# Patient Record
Sex: Female | Born: 1995 | Race: Black or African American | Hispanic: No | Marital: Single | Smoking: Never smoker
Health system: Southern US, Community
[De-identification: ages and names within clinical notes are randomized; demographics above are authoritative.]

## PROBLEM LIST (undated history)

## (undated) ENCOUNTER — Inpatient Hospital Stay: Payer: Self-pay

## (undated) DIAGNOSIS — A549 Gonococcal infection, unspecified: Secondary | ICD-10-CM

## (undated) DIAGNOSIS — A749 Chlamydial infection, unspecified: Secondary | ICD-10-CM

## (undated) DIAGNOSIS — Z789 Other specified health status: Secondary | ICD-10-CM

## (undated) HISTORY — PX: TOOTH EXTRACTION: SUR596

---
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
## 2017-02-05 ENCOUNTER — Telehealth: Payer: Self-pay | Admitting: Obstetrics and Gynecology

## 2017-02-05 DIAGNOSIS — Z30013 Encounter for initial prescription of injectable contraceptive: Secondary | ICD-10-CM

## 2017-02-05 MED ORDER — MEDROXYPROGESTERONE ACETATE 150 MG/ML IM SUSP: 150.0000 mg | INTRAMUSCULAR | 1 refills | Status: DC

## 2017-02-05 NOTE — Telephone Encounter (Signed)
Please advise regarding medication change. Thank you.

## 2017-02-05 NOTE — Telephone Encounter (Signed)
Rx depo sent to pharmacy. Pt to RTO with menses for injection. Condoms for 1 mo. RN to notify pt. RTO 8/18 annual.

## 2017-02-05 NOTE — Telephone Encounter (Signed)
Pt is calling and was seen 12/06/16 for an birthcontrol conference. Pt is not liking having the patches. And would like to change to Depo injection.

## 2017-03-11 ENCOUNTER — Ambulatory Visit: Payer: Self-pay | Admitting: Obstetrics and Gynecology

## 2017-03-11 ENCOUNTER — Encounter: Payer: Self-pay | Admitting: Obstetrics and Gynecology

## 2017-06-10 ENCOUNTER — Ambulatory Visit (INDEPENDENT_AMBULATORY_CARE_PROVIDER_SITE_OTHER): Payer: Medicaid Other | Admitting: Podiatry

## 2017-06-10 VITALS — BP 130/79 | HR 78 | Resp 18

## 2017-06-10 DIAGNOSIS — L603 Nail dystrophy: Secondary | ICD-10-CM

## 2017-06-10 DIAGNOSIS — W450XXA Nail entering through skin, initial encounter: Secondary | ICD-10-CM

## 2017-06-10 NOTE — Progress Notes (Signed)
Subjective:     Patient ID: Briana Ortega, female   DOB: 07/07/1996, 21 y.o.   MRN: [id number]  HPI this patient presents the office with chief complaint of malformed big toenails on both feet.  She says she jammed her toes for months ago at work and there now appears to be discoloration forming on both toes  . She says she injured the toes 1 day and then the next day.  She says there was initially pain, but there has been no drainage or pus noted  . She presents the office today for an evaluation of her nail plates both big toes..   Review of Systems     Objective:   Physical Exam GENERAL APPEARANCE: Alert, conversant. Appropriately groomed. No acute distress.  VASCULAR: Pedal pulses are  palpable at  [facility].  Capillary refill time is immediate to all digits,  Normal temperature gradient.  Digital hair growth is present bilateral  NEUROLOGIC: sensation is normal to 5.07 monofilament at 5/5 sites bilateral.  Light touch is intact bilateral, Muscle strength normal.  MUSCULOSKELETAL: acceptable muscle strength, tone and stability bilateral.  Intrinsic muscluature intact bilateral.  Rectus appearance of foot and digits noted bilateral.  NAILS  are yellow disfigurement noted along the lateral border of the left great toe.  Both toes have an elevation of the distal plate of the hallux bilateral.  Normal. The nail attachment proximally.  No evidence of any redness, swelling or drainage.  No pain noted or elicited upon palpation DERMATOLOGIC: skin color, texture, and turgor are within normal limits.  No preulcerative lesions or ulcers  are seen, no interdigital maceration noted.  No open lesions present.   No drainage noted.      Assessment:     Nail Dystrophy  Nail Injury  Hallux nails  B/L    Plan:     IE  Discussed condition with the patient.  We are going to allow the nail to continue to grow out completely and then decide on a treatment plan at that time.  Return to clinic when  necessary   Helane GuntherGregory Tyee Vandevoorde DPM

## 2019-07-13 ENCOUNTER — Ambulatory Visit (INDEPENDENT_AMBULATORY_CARE_PROVIDER_SITE_OTHER): Payer: Medicaid Other | Admitting: Obstetrics and Gynecology

## 2019-07-13 ENCOUNTER — Encounter: Payer: Self-pay | Admitting: Obstetrics and Gynecology

## 2019-07-13 ENCOUNTER — Other Ambulatory Visit (HOSPITAL_COMMUNITY)
Admission: RE | Admit: 2019-07-13 | Discharge: 2019-07-13 | Disposition: A | Discharge status: Home / Self Care | Payer: Medicaid Other | Source: Ambulatory Visit | Attending: Obstetrics and Gynecology | Admitting: Obstetrics and Gynecology

## 2019-07-13 ENCOUNTER — Other Ambulatory Visit: Payer: Self-pay

## 2019-07-13 VITALS — BP 90/70 | Ht 66.5 in | Wt 113.0 lb

## 2019-07-13 DIAGNOSIS — Z113 Encounter for screening for infections with a predominantly sexual mode of transmission: Secondary | ICD-10-CM

## 2019-07-13 DIAGNOSIS — Z30011 Encounter for initial prescription of contraceptive pills: Secondary | ICD-10-CM

## 2019-07-13 MED ORDER — MICROGESTIN 24 FE 1-20 MG-MCG PO TABS: 1.0000 | ORAL_TABLET | Freq: Every day | ORAL | 3 refills | Status: DC

## 2019-07-13 NOTE — Progress Notes (Signed)
System, Provider Not In   Chief Complaint  Patient presents with  . Contraception    wants OCP's    HPI:      Briana Ortega is a 23 y.o. G1P1001 who LMP was Patient's last menstrual period was 06/26/2019 (approximate)., presents today for [facility] conf. Wants to start OCPs. Did xulane and depo in past. Had constant irreg bleeding with depo, doesn't remember issue with xulane. No hx of HTN, DVTs, migraines with aura. Menses are monthly, last 9 days, no BTB, mild dsymen. Pt is sex active, using condoms. No recent STD testing. Hx of gon/chlam in past.    Past Medical History:  Diagnosis Date  . Chlamydia   . GC (gonococcus infection)     Past Surgical History:  Procedure Laterality Date  . CESAREAN SECTION N/A 05/30/2016   Procedure: CESAREAN SECTION;  Surgeon: Gae Dry, MD;  Location: [facility] ORS;  Service: Obstetrics;  Laterality: N/A;  . TOOTH EXTRACTION      Family History  Problem Relation Age of Onset  . Cancer Maternal Grandmother        COLON  . Cancer Maternal Grandfather        COLON  . Cancer Other        COLON    Social History   Socioeconomic History  . Marital status: Single    Spouse name: Not on file  . Number of children: Not on file  . Years of education: Not on file  . Highest education level: Not on file  Occupational History  . Not on file  Social Needs  . Financial resource strain: Not on file  . Food insecurity    Worry: Not on file    Inability: Not on file  . Transportation needs    Medical: Not on file    Non-medical: Not on file  Tobacco Use  . Smoking status: Never Smoker  . Smokeless tobacco: Never Used  Substance and Sexual Activity  . Alcohol use: No  . Drug use: No  . Sexual activity: Yes    Birth control/protection: Condom  Lifestyle  . Physical activity    Days per week: Not on file    Minutes per session: Not on file  . Stress: Not on file  Relationships  . Social Herbalist on phone: Not on file     Gets together: Not on file    Attends religious service: Not on file    Active member of club or organization: Not on file    Attends meetings of clubs or organizations: Not on file    Relationship status: Not on file  . Intimate partner violence    Fear of current or ex partner: Not on file    Emotionally abused: Not on file    Physically abused: Not on file    Forced sexual activity: Not on file  Other Topics Concern  . Not on file  Social History Narrative   ** Merged History Encounter **        Outpatient Medications Prior to Visit  Medication Sig Dispense Refill  . ibuprofen (ADVIL,MOTRIN) 600 MG tablet Take 1 tablet (600 mg total) by mouth every 6 (six) hours as needed. 45 tablet 1   No facility-administered medications prior to visit.       ROS:  Review of Systems  Constitutional: Negative for fatigue, fever and unexpected weight change.  Respiratory: Negative for cough, shortness of breath and wheezing.  Cardiovascular: Negative for chest pain, palpitations and leg swelling.  Gastrointestinal: Negative for blood in stool, constipation, diarrhea, nausea and vomiting.  Endocrine: Negative for cold intolerance, heat intolerance and polyuria.  Genitourinary: Negative for dyspareunia, dysuria, flank pain, frequency, genital sores, hematuria, menstrual problem, pelvic pain, urgency, vaginal bleeding, vaginal discharge and vaginal pain.  Musculoskeletal: Negative for back pain, joint swelling and myalgias.  Skin: Negative for rash.  Neurological: Negative for dizziness, syncope, light-headedness, numbness and headaches.  Hematological: Negative for adenopathy.  Psychiatric/Behavioral: Negative for agitation, confusion, sleep disturbance and suicidal ideas. The patient is not nervous/anxious.    BREAST: No symptoms   OBJECTIVE:   Vitals:  BP 90/70   Ht 5' 6.5" (1.689 m)   Wt 113 lb (51.3 kg)   LMP 06/26/2019 (Approximate)   Breastfeeding No   BMI 17.97 kg/m    Physical Exam Vitals signs reviewed.  Constitutional:      Appearance: She is well-developed.  Neck:     Musculoskeletal: Normal range of motion.  Pulmonary:     Effort: Pulmonary effort is normal.  Genitourinary:    General: Normal vulva.     Pubic Area: No rash.      Labia:        Right: No rash, tenderness or lesion.        Left: No rash, tenderness or lesion.      Vagina: Normal. No vaginal discharge, erythema or tenderness.     Cervix: Normal.     Uterus: Normal. Not enlarged and not tender.      Adnexa: Right adnexa normal and left adnexa normal.       Right: No mass or tenderness.         Left: No mass or tenderness.    Musculoskeletal: Normal range of motion.  Skin:    General: Skin is warm and dry.  Neurological:     General: No focal deficit present.     Mental Status: She is alert and oriented to person, place, and time.  Psychiatric:        Mood and Affect: Mood normal.        Behavior: Behavior normal.        Thought Content: Thought content normal.        Judgment: Judgment normal.     Assessment/Plan: Encounter for initial prescription of contraceptive pills - Plan: Norethindrone Acetate-Ethinyl Estrad-FE (MICROGESTIN 24 FE) 1-20 MG-MCG(24) tablet; OCP start with next menses. Rx eRxd. Condoms. F/u prn.   Screening for STD (sexually transmitted disease) - Plan: Cervicovaginal ancillary only    Meds ordered this encounter  Medications  . Norethindrone Acetate-Ethinyl Estrad-FE (MICROGESTIN 24 FE) 1-20 MG-MCG(24) tablet    Sig: Take 1 tablet by mouth daily.    Dispense:  84 tablet    Refill:  3    Order Specific Question:   Supervising Provider    Answer:   Nadara MustardHARRIS, ROBERT P [[id number]]      Return in about 1 year (around 07/12/2020) for annual.  Viviene Thurston B. Jeena Arnett, PA-C 07/13/2019 3:08 PM

## 2019-07-13 NOTE — Patient Instructions (Signed)
I value your feedback and entrusting us with your care. If you get a [facility] patient survey, I would appreciate you taking the time to let us know about your experience today. Thank you! 

## 2019-07-15 LAB — CERVICOVAGINAL ANCILLARY ONLY
Chlamydia: NEGATIVE
Neisseria Gonorrhea: NEGATIVE

## 2019-08-04 DIAGNOSIS — Z20828 Contact with and (suspected) exposure to other viral communicable diseases: Secondary | ICD-10-CM | POA: Diagnosis not present

## 2020-01-13 ENCOUNTER — Emergency Department
Admission: EM | Admit: 2020-01-13 | Discharge: 2020-01-14 | Discharge status: Against Medical Advice | Payer: Medicaid Other | Attending: Emergency Medicine | Admitting: Emergency Medicine

## 2020-01-13 ENCOUNTER — Other Ambulatory Visit: Payer: Self-pay

## 2020-01-13 DIAGNOSIS — Z20822 Contact with and (suspected) exposure to covid-19: Secondary | ICD-10-CM | POA: Diagnosis not present

## 2020-01-13 DIAGNOSIS — L03317 Cellulitis of buttock: Secondary | ICD-10-CM

## 2020-01-13 DIAGNOSIS — R109 Unspecified abdominal pain: Secondary | ICD-10-CM | POA: Diagnosis not present

## 2020-01-13 DIAGNOSIS — R222 Localized swelling, mass and lump, trunk: Secondary | ICD-10-CM | POA: Diagnosis present

## 2020-01-13 DIAGNOSIS — E876 Hypokalemia: Secondary | ICD-10-CM | POA: Insufficient documentation

## 2020-01-13 DIAGNOSIS — A419 Sepsis, unspecified organism: Secondary | ICD-10-CM | POA: Insufficient documentation

## 2020-01-13 DIAGNOSIS — L0291 Cutaneous abscess, unspecified: Secondary | ICD-10-CM | POA: Diagnosis not present

## 2020-01-13 DIAGNOSIS — Z793 Long term (current) use of hormonal contraceptives: Secondary | ICD-10-CM | POA: Diagnosis not present

## 2020-01-13 LAB — COMPREHENSIVE METABOLIC PANEL
ALT: 13 U/L (ref 0–44)
AST: 18 U/L (ref 15–41)
Albumin: 4.3 g/dL (ref 3.5–5.0)
Alkaline Phosphatase: 65 U/L (ref 38–126)
Anion gap: 9 (ref 5–15)
BUN: 11 mg/dL (ref 6–20)
CO2: 25 mmol/L (ref 22–32)
Calcium: 8.9 mg/dL (ref 8.9–10.3)
Chloride: 100 mmol/L (ref 98–111)
Creatinine, Ser: 0.83 mg/dL (ref 0.44–1.00)
GFR calc Af Amer: >60 mL/min (ref >60)
GFR calc non Af Amer: >60 mL/min (ref >60)
Glucose, Bld: 112 mg/dL — ABNORMAL HIGH (ref 70–99)
Potassium: 3 mmol/L — ABNORMAL LOW (ref 3.5–5.1)
Sodium: 134 mmol/L — ABNORMAL LOW (ref 135–145)
Total Bilirubin: 1.6 mg/dL — ABNORMAL HIGH (ref 0.3–1.2)
Total Protein: 7.7 g/dL (ref 6.5–8.1)

## 2020-01-13 LAB — CBC
HCT: 32.4 % — ABNORMAL LOW (ref 36.0–46.0)
Hemoglobin: 10.6 g/dL — ABNORMAL LOW (ref 12.0–15.0)
MCH: 29.3 pg (ref 26.0–34.0)
MCHC: 32.7 g/dL (ref 30.0–36.0)
MCV: 89.5 fL (ref 80.0–100.0)
Platelets: 277 10*3/uL (ref 150–400)
RBC: 3.62 MIL/uL — ABNORMAL LOW (ref 3.87–5.11)
RDW: 13.4 % (ref 11.5–15.5)
WBC: 18.3 10*3/uL — ABNORMAL HIGH (ref 4.0–10.5)
nRBC: 0 % (ref 0.0–0.2)

## 2020-01-13 LAB — LACTIC ACID, PLASMA: Lactic Acid, Venous: 1.1 mmol/L (ref 0.5–1.9)

## 2020-01-13 LAB — PROCALCITONIN: Procalcitonin: 0.2 ng/mL

## 2020-01-13 LAB — POCT PREGNANCY, URINE: Preg Test, Ur: NEGATIVE

## 2020-01-13 MED ORDER — ACETAMINOPHEN 500 MG PO TABS
1000.0000 mg | ORAL_TABLET | Freq: Once | ORAL | Status: AC
  Administered 2020-01-13: 1000 mg via ORAL

## 2020-01-13 MED ORDER — LACTATED RINGERS IV BOLUS (SEPSIS)
1000.0000 mL | Freq: Once | INTRAVENOUS | Status: AC
  Administered 2020-01-14: 1000 mL via INTRAVENOUS

## 2020-01-13 MED ORDER — ACETAMINOPHEN 500 MG PO TABS
ORAL_TABLET | ORAL | Status: AC
  Filled 2020-01-13: qty 2

## 2020-01-13 MED ORDER — VANCOMYCIN HCL IN DEXTROSE 1-5 GM/200ML-% IV SOLN
1000.0000 mg | Freq: Once | INTRAVENOUS | Status: AC
  Administered 2020-01-14: 1000 mg via INTRAVENOUS
  Filled 2020-01-13: qty 200

## 2020-01-13 MED ORDER — SODIUM CHLORIDE 0.9 % IV SOLN
2.0000 g | Freq: Once | INTRAVENOUS | Status: AC
  Administered 2020-01-14: 2 g via INTRAVENOUS
  Filled 2020-01-13: qty 20

## 2020-01-13 NOTE — ED Notes (Signed)
One set of blood cultures drawn and sent to the lab, but not ordered.

## 2020-01-13 NOTE — ED Provider Notes (Signed)
[facility] Emergency Department Provider Note  ____________________________________________   First MD Initiated Contact with Patient 01/13/20 2304     (approximate)  I have reviewed the triage vital signs and the nursing notes.   HISTORY  Chief Complaint Abscess    HPI Briana Ortega is a 24 y.o. female with medical history as listed below and no prior history of cutaneous abscesses or cellulitis.  She presents by private vehicle tonight for evaluation of gradually worsening redness and swelling to the right buttocks which she believes is from localized infection.  It has been steadily getting worse and recently has drained a little bit.  However by today the pain is severe and she developed fever and chills.  She has felt "a little bit out of it" but not had any nausea, vomiting, abdominal pain, shortness of breath, sore throat, dysuria, nor cough.  Upon arrival  to the ED she has tachycardia and a fever of 103.  She has not taken any antibiotics.  Nothing in particular makes the symptoms better or worse and they are currently severe.  She has not had any contact with COVID-19 patients and has never been diagnosed with COVID-19 herself.        Past Medical History:  Diagnosis Date  . Chlamydia   . GC (gonococcus infection)     Patient Active Problem List   Diagnosis Date Noted  . Postpartum care following cesarean delivery 06/01/2016  . Normal labor and delivery 05/29/2016  . Abdominal pain affecting pregnancy 05/08/2016  . Indication for care in labor and delivery, antepartum 05/08/2016  . Chlamydia trachomatis infection 07/03/2013  . Gonorrhea in female 07/03/2013  . Vaginal discharge 07/01/2013  . BV (bacterial vaginosis) 07/01/2013  . Sexually active at young age 96/20/2014  . Neck pain 02/24/2013  . Well child check 01/11/2012    Past Surgical History:  Procedure Laterality Date  . CESAREAN SECTION N/A 05/30/2016   Procedure: CESAREAN  SECTION;  Surgeon: Nadara Mustard, MD;  Location: [facility] ORS;  Service: Obstetrics;  Laterality: N/A;  . TOOTH EXTRACTION      Prior to Admission medications   Medication Sig Start Date End Date Taking? Authorizing Provider  cephALEXin (KEFLEX) 500 MG capsule Take 1 capsule (500 mg total) by mouth 4 (four) times daily for 10 days. 01/14/20 01/24/20  Loleta Rose, MD  doxycycline (VIBRAMYCIN) 100 MG capsule Take 1 capsule (100 mg total) by mouth 2 (two) times daily for 10 days. 01/14/20 01/24/20  Loleta Rose, MD  Norethindrone Acetate-Ethinyl Estrad-FE (MICROGESTIN 24 FE) 1-20 MG-MCG(24) tablet Take 1 tablet by mouth daily. Patient not taking: Reported on 01/14/2020 07/13/19   Copland, Ilona Sorrel, PA-C    Allergies Patient has no known allergies.  Family History  Problem Relation Age of Onset  . Cancer Maternal Grandmother        COLON  . Cancer Maternal Grandfather        COLON  . Cancer Other        COLON    Social History Social History   Tobacco Use  . Smoking status: Never Smoker  . Smokeless tobacco: Never Used  Substance Use Topics  . Alcohol use: No  . Drug use: No    Review of Systems Constitutional: +fever/chills, general malaise. Eyes: No visual changes. ENT: No sore throat. Cardiovascular: Denies chest pain. Respiratory: Denies shortness of breath. Gastrointestinal: No abdominal pain.  No nausea, no vomiting.  No diarrhea.  No constipation. Genitourinary: Negative for dysuria.  Musculoskeletal: Negative for neck pain.  Negative for back pain. Integumentary: Gradually worsening redness and some drainage to an apparent abscess on the right buttocks x2 weeks, significantly worse over the last couple of days. Neurological: Negative for headaches, focal weakness or numbness.   ____________________________________________   PHYSICAL EXAM:  VITAL SIGNS: ED Triage Vitals  Enc Vitals Group     BP 01/13/20 2157 126/76     Pulse Rate 01/13/20 2157 (!) 141     Resp  01/13/20 2157 20     Temp 01/13/20 2157 (!) 103.2 F (39.6 C)     Temp Source 01/13/20 2157 Oral     SpO2 01/13/20 2157 99 %     Weight 01/13/20 2159 54.4 kg (120 lb)     Height 01/13/20 2159 1.676 m (5\' 6" )     Head Circumference --      Peak Flow --      Pain Score 01/13/20 2158 9     Pain Loc --      Pain Edu? --      Excl. in GC? --     Constitutional: Alert and oriented.  Eyes: Conjunctivae are normal.  Head: Atraumatic. Nose: No congestion/rhinnorhea. Mouth/Throat: Patient is wearing a mask. Neck: No stridor.  No meningeal signs.   Cardiovascular: Tachycardia, regular rhythm. Good peripheral circulation. Grossly normal heart sounds. Respiratory: Normal respiratory effort.  No retractions. Gastrointestinal: Soft and nontender. No distention.  Musculoskeletal: No lower extremity tenderness nor edema. No gross deformities of extremities. Neurologic:  Normal speech and language. No gross focal neurologic deficits are appreciated.  Skin: Patient has a large indurated and erythematous area on the proximal in her right buttock that extends towards the labia and towards the anus.  It it is approximately 8 cm x 8 cm in diameter.  There is a central area of swelling and increased induration but no fluctuance.  There is a small central white spot that appears consistent with an area that was draining previously, but there is no longer any fluid able to be expressed from the site.  Severe tenderness to palpation.  ED chaperone 2159) present throughout exam. Psychiatric: Mood and affect are normal. Speech and behavior are normal.  ____________________________________________   LABS (all labs ordered are listed, but only abnormal results are displayed)  Labs Reviewed  CBC - Abnormal; Notable for the following components:      Result Value   WBC 18.3 (*)    RBC 3.62 (*)    Hemoglobin 10.6 (*)    HCT 32.4 (*)    All other components within normal limits  COMPREHENSIVE METABOLIC  PANEL - Abnormal; Notable for the following components:   Sodium 134 (*)    Potassium 3.0 (*)    Glucose, Bld 112 (*)    Total Bilirubin 1.6 (*)    All other components within normal limits  URINALYSIS, COMPLETE (UACMP) WITH MICROSCOPIC - Abnormal; Notable for the following components:   Color, Urine YELLOW (*)    APPearance HAZY (*)    Leukocytes,Ua SMALL (*)    Bacteria, UA MANY (*)    All other components within normal limits  RESPIRATORY PANEL BY RT PCR (FLU A&B, COVID)  CULTURE, BLOOD (ROUTINE X 2)  CULTURE, BLOOD (ROUTINE X 2)  LACTIC ACID, PLASMA  LACTIC ACID, PLASMA  PROCALCITONIN  MAGNESIUM  POC URINE PREG, ED  POC SARS CORONAVIRUS 2 AG -  ED  POCT PREGNANCY, URINE  POC SARS CORONAVIRUS 2 AG   ____________________________________________  EKG  None - EKG not ordered by ED physician ____________________________________________  RADIOLOGY I, Loleta Rose, personally viewed and evaluated these images (plain radiographs) as part of my medical decision making, as well as reviewing the written report by the radiologist.  ED MD interpretation: Extensive cellulitis with a small area of phlegmon versus abscess.  Official radiology report(s): CT PELVIS W CONTRAST  Result Date: 01/14/2020 CLINICAL DATA:  Right buttock abscess. EXAM: CT PELVIS WITH CONTRAST TECHNIQUE: Multidetector CT imaging of the pelvis was performed using the standard protocol following the bolus administration of intravenous contrast. CONTRAST:  OMNIPAQUE IOHEXOL 300 MG/ML  SOLN COMPARISON:  None. FINDINGS: There is a diffuse edematous pattern within the knee medial right buttock affecting primarily the subcutaneous fatty tissues. At the superior edge, there is a 2 cm area that appear slightly more discrete and low density that could actually be drainable fluid. This may be Peri rectal or adjacent to the right anal verge. No deep pelvic extension. The patient does have a small amount of free fluid in the  pelvis, but this is often seen in young females. The uterus is self appears normal. No adnexal mass. No other bowel finding the pelvis. No bone abnormality. IMPRESSION: Cellulitis type pattern extensively throughout the right medial inferior buttock. Slightly more discrete 2 cm low-density area which is either perirectal on the right or adjacent to the right anal verge, which could have been the source of the infection. This could be a small drainable collection, but the majority of the inflammatory change looks more like cellulitis. Electronically Signed   By: Paulina Fusi M.D.   On: 01/14/2020 01:06    ____________________________________________   PROCEDURES   Procedure(s) performed (including Critical Care):  .Critical Care Performed by: Loleta Rose, MD Authorized by: Loleta Rose, MD   Critical care provider statement:    Critical care time (minutes):  30   Critical care time was exclusive of:  Separately billable procedures and treating other patients   Critical care was necessary to treat or prevent imminent or life-threatening deterioration of the following conditions:  Sepsis   Critical care was time spent personally by me on the following activities:  Development of treatment plan with patient or surrogate, discussions with consultants, evaluation of patient's response to treatment, examination of patient, obtaining history from patient or surrogate, ordering and performing treatments and interventions, ordering and review of laboratory studies, ordering and review of radiographic studies, pulse oximetry, re-evaluation of patient's condition and review of old charts     ____________________________________________   INITIAL IMPRESSION / MDM / ASSESSMENT AND PLAN / ED COURSE  As part of my medical decision making, I reviewed the following data within the electronic MEDICAL RECORD NUMBER Nursing notes reviewed and incorporated, Labs reviewed , Old chart reviewed, Discussed with  surgeon (Dr. Everlene Farrier), Discussed with admitting physician (Dr. Para March) and Notes from prior ED visits   Differential diagnosis includes, but is not limited to, sepsis, cutaneous abscess, cellulitis, necrotizing fasciitis, bacteremia, COVID-19 or other respiratory infection.  The patient has no respiratory symptoms and no symptoms of urinary tract infection.  Her vital signs and initial lab results are strongly suggestive of sepsis though fortunately not severe sepsis or septic shock.  She is not hypotensive but is tachycardic in the 140s, has a fever 103, and has a leukocytosis of 18.3.  Her potassium is low at 3.0 but otherwise her comprehensive metabolic panel is within normal limits and her lactic acid is 1.1.  I have  ordered sepsis protocol including 1 L of lactated Ringer IV bolus, ceftriaxone 2 g IV, and I am holding initially before adding vancomycin.  I am awaiting a ED nurse or chaperone to evaluate the patient's buttocks to determine the extent of the wound but will start empiric treatment and then determine whether this is something that could be drained at bedside or needs to be treated with IV antibiotics or possibly surgical consult.  Patient understands and agrees with the plan.  I am also checking a rapid Covid swab to rule this out as a possibility as well as prepare for surgery if in fact that is needed.      Clinical Course as of Jan 13 249  Wed Jan 13, 2020  2334 I evaluated the patient's abscess with ED nurse Alyssa.  It is a very large area that tracks towards her genitals as described in more detail above.  Given the extent of the infection and the area involved, as well as meeting sepsis criteria, I have opted to further evaluate with a CT pelvis to determine the extent, depth, and consistency (phlegmon versus abscess versus cellulitis) of the wound as well as to rule out more insidious conditions such as necrotizing fasciitis/Fournier's gangrene.   [CF]  Thu Jan 14, 2020  0018  Procalcitonin: 0.20 [CF]  0045 Lactic Acid, Venous: 1.0 [CF]  0119 Possible drainable fluid collection, but extensive cellulitis.  Paging Dr. Everlene Farrier to discuss.   [CF]  0137 Ordering Decadron 10 mg IV to help with the inflammation.   [CF]  0141 I spoke by phone with Dr. Everlene Farrier.  He reviewed the CT scan and agrees that I&D at this point would not be advisable and he thinks she will respond to antibiotics.  He said that they would consult but agrees with the plan for admission to the hospitalist for sepsis and cellulitis treatment.  I am placing a hospitalist consult.   [CF]  0200 SARS Coronavirus 2 by RT PCR: NEGATIVE [CF]  0206 Given that the patient should not need the OR soon, I ordered a regular diet and 40 meq of PO potassium.   [CF]  0206 Patient's tachycardia has resolved (HR 96) and BP is stable.   [CF]  0223 Magnesium: 2.0 [CF]  0232 Discussed with Dr. Para March by phone for admission.  However, patient is trying to decide whether or not to leave AMA due to child care issues.  Will update shortly.   [CF]  0249 Unfortunately the patient was unable to find adequate childcare and is leaving AGAINST MEDICAL ADVICE.  I had my usual AMA discussion with the patient including the fact that by her choosing to leave, she is risking more severe infection, extended hospitalization, or even death.  She understands and says that she will come back to the emergency department when she can find childcare.  I explained that I cannot promise exactly what will happen on that visit and that she will essentially be starting over but she knows she can come back at any time.  I also wrote her prescriptions for Keflex and doxycycline in case she is unable to come back as an attempt to treat her infection as an outpatient.  She agrees with the plan.   [CF]    Clinical Course User Index [CF] Loleta Rose, MD     ____________________________________________  FINAL CLINICAL IMPRESSION(S) / ED DIAGNOSES  Final  diagnoses:  Sepsis without acute organ dysfunction, due to unspecified organism [facility])  Cellulitis of right  buttock  Phlegmon  Hypokalemia     MEDICATIONS GIVEN DURING THIS VISIT:  Medications  acetaminophen (TYLENOL) tablet 1,000 mg (1,000 mg Oral Given 01/13/20 2205)  lactated ringers bolus 1,000 mL (0 mLs Intravenous Stopped 01/14/20 0205)  cefTRIAXone (ROCEPHIN) 2 g in sodium chloride 0.9 % 100 mL IVPB (0 g Intravenous Stopped 01/14/20 0129)  vancomycin (VANCOCIN) IVPB 1000 mg/200 mL premix (0 mg Intravenous Stopped 01/14/20 0243)  iohexol (OMNIPAQUE) 300 MG/ML solution 100 mL (100 mLs Intravenous Contrast Given 01/14/20 0036)  potassium chloride (KLOR-CON) packet 40 mEq (40 mEq Oral Given 01/14/20 0206)  dexamethasone (DECADRON) injection 10 mg (10 mg Intravenous Given 01/14/20 0206)     ED Discharge Orders         Ordered    cephALEXin (KEFLEX) 500 MG capsule  4 times daily     01/14/20 0246    doxycycline (VIBRAMYCIN) 100 MG capsule  2 times daily     01/14/20 0246          *Please note:  Kelle Ruppert was evaluated in Emergency Department on 01/14/2020 for the symptoms described in the history of present illness. She was evaluated in the context of the global COVID-19 pandemic, which necessitated consideration that the patient might be at risk for infection with the SARS-CoV-2 virus that causes COVID-19. Institutional protocols and algorithms that pertain to the evaluation of patients at risk for COVID-19 are in a state of rapid change based on information released by regulatory bodies including the CDC and federal and state organizations. These policies and algorithms were followed during the patient's care in the ED.  Some ED evaluations and interventions may be delayed as a result of limited staffing during the pandemic.*  Note:  This document was prepared using Dragon voice recognition software and may include unintentional dictation errors.   Hinda Kehr, MD 01/14/20 [phone]

## 2020-01-13 NOTE — ED Triage Notes (Addendum)
Pt in with co abscess to right buttocks x 2 weeks, states has drained some. Pt developed a fever and chills today. PT denies any other symptoms at this time.

## 2020-01-13 NOTE — ED Notes (Signed)
MD York Cerise at bedside with this RN inspecting abscess.

## 2020-01-14 ENCOUNTER — Emergency Department: Payer: Medicaid Other

## 2020-01-14 ENCOUNTER — Encounter: Payer: Self-pay | Admitting: Radiology

## 2020-01-14 DIAGNOSIS — L0231 Cutaneous abscess of buttock: Secondary | ICD-10-CM | POA: Diagnosis not present

## 2020-01-14 DIAGNOSIS — L03317 Cellulitis of buttock: Secondary | ICD-10-CM | POA: Diagnosis not present

## 2020-01-14 DIAGNOSIS — E876 Hypokalemia: Secondary | ICD-10-CM | POA: Diagnosis not present

## 2020-01-14 DIAGNOSIS — A419 Sepsis, unspecified organism: Secondary | ICD-10-CM | POA: Diagnosis not present

## 2020-01-14 DIAGNOSIS — Z20822 Contact with and (suspected) exposure to covid-19: Secondary | ICD-10-CM | POA: Diagnosis not present

## 2020-01-14 LAB — URINALYSIS, COMPLETE (UACMP) WITH MICROSCOPIC
Bilirubin Urine: NEGATIVE
Glucose, UA: NEGATIVE mg/dL
Hgb urine dipstick: NEGATIVE
Ketones, ur: NEGATIVE mg/dL
Nitrite: NEGATIVE
Protein, ur: NEGATIVE mg/dL
Specific Gravity, Urine: 1.008 (ref 1.005–1.030)
pH: 6 (ref 5.0–8.0)

## 2020-01-14 LAB — MAGNESIUM: Magnesium: 2 mg/dL (ref 1.7–2.4)

## 2020-01-14 LAB — RESPIRATORY PANEL BY RT PCR (FLU A&B, COVID)
Influenza A by PCR: NEGATIVE
Influenza B by PCR: NEGATIVE
SARS Coronavirus 2 by RT PCR: NEGATIVE

## 2020-01-14 LAB — LACTIC ACID, PLASMA: Lactic Acid, Venous: 1 mmol/L (ref 0.5–1.9)

## 2020-01-14 LAB — POC SARS CORONAVIRUS 2 AG: SARS Coronavirus 2 Ag: NEGATIVE

## 2020-01-14 MED ORDER — POTASSIUM CHLORIDE 20 MEQ PO PACK
40.0000 meq | PACK | ORAL | Status: AC
  Administered 2020-01-14: 40 meq via ORAL
  Filled 2020-01-14: qty 2

## 2020-01-14 MED ORDER — DEXAMETHASONE SODIUM PHOSPHATE 10 MG/ML IJ SOLN
10.0000 mg | Freq: Once | INTRAMUSCULAR | Status: AC
  Administered 2020-01-14: 10 mg via INTRAVENOUS
  Filled 2020-01-14: qty 1

## 2020-01-14 MED ORDER — DOXYCYCLINE HYCLATE 100 MG PO CAPS: 100.0000 mg | ORAL_CAPSULE | Freq: Two times a day (BID) | ORAL | 0 refills | Status: DC

## 2020-01-14 MED ORDER — CEPHALEXIN 500 MG PO CAPS: 500.0000 mg | ORAL_CAPSULE | Freq: Four times a day (QID) | ORAL | 0 refills | Status: DC

## 2020-01-14 MED ORDER — IOHEXOL 300 MG/ML  SOLN
100.0000 mL | Freq: Once | INTRAMUSCULAR | Status: AC | PRN
  Administered 2020-01-14: 100 mL via INTRAVENOUS

## 2020-01-14 NOTE — Progress Notes (Signed)
CODE SEPSIS - PHARMACY COMMUNICATION  **Broad Spectrum Antibiotics should be administered within 1 hour of Sepsis diagnosis**  Time Code Sepsis Called/Page Received: 2322  Antibiotics Ordered: Rocephin and Vancomycin  Time of 1st antibiotic administration: 0005, Rocephin  Additional action taken by pharmacy: n/a   If necessary, Name of Provider/Nurse Contacted: n/a    Wayland Denis ,PharmD Clinical Pharmacist  01/14/2020  12:52 AM

## 2020-01-14 NOTE — ED Notes (Signed)
This RN spoke with pt about admission status and pt states that she was not expecting to get admitted to the hospital and that she has a child at home to care for. Pt states that she would like to get information from doctor about other care plans available. MD York Cerise made aware.

## 2020-01-14 NOTE — ED Notes (Signed)
This RN asked pt her decision on getting admitted, pt states that she is going to be discharged. MD York Cerise made aware.

## 2020-01-14 NOTE — Discharge Instructions (Signed)
As we discussed, I strongly recommended that you stay in the hospital for IV antibiotics.  You have a bad infection and qualify for condition called sepsis.  If you did not receive adequate treatment you can become even more sick, eventually requiring a longer hospitalization, more severe illness, or in extreme cases, even death.  You are leaving AGAINST MEDICAL ADVICE but I understand it is for the purposes of childcare.  Please understand that you can return to the emergency department for reevaluation at any time.  I cannot guarantee exactly what will happen when you come back, such as I cannot promise you will be admitted, because it will be based on the presentation at that time, but I do encourage you to return to the emergency department if you are able to do so.  Otherwise I have prescribed 2 antibiotics for you and I encourage you to take both of them as directed for the next 10 days.  Use heating pads and/or warm bath soaks for the infection in your buttock.  I also gave you the name and number of a surgeon with whom you can follow-up as an outpatient if you choose to do so.  Use over-the-counter ibuprofen and Tylenol as needed according to label instructions for pain.

## 2020-01-14 NOTE — ED Notes (Signed)
This RN is unable to start vancomycin at this time due to pt being in CT scan.

## 2020-01-14 NOTE — ED Notes (Signed)
This RN witnessed pt sign AMA papers.

## 2020-01-15 ENCOUNTER — Other Ambulatory Visit: Payer: Self-pay

## 2020-01-15 ENCOUNTER — Encounter: Payer: Self-pay | Admitting: Emergency Medicine

## 2020-01-15 ENCOUNTER — Inpatient Hospital Stay
Admission: EM | Admit: 2020-01-15 | Discharge: 2020-01-17 | DRG: 581 | Disposition: A | Discharge status: Home / Self Care | Payer: Medicaid Other | Attending: Internal Medicine | Admitting: Internal Medicine

## 2020-01-15 DIAGNOSIS — A419 Sepsis, unspecified organism: Secondary | ICD-10-CM

## 2020-01-15 DIAGNOSIS — Z809 Family history of malignant neoplasm, unspecified: Secondary | ICD-10-CM | POA: Diagnosis not present

## 2020-01-15 DIAGNOSIS — E876 Hypokalemia: Secondary | ICD-10-CM | POA: Diagnosis present

## 2020-01-15 DIAGNOSIS — Z79899 Other long term (current) drug therapy: Secondary | ICD-10-CM

## 2020-01-15 DIAGNOSIS — Z20822 Contact with and (suspected) exposure to covid-19: Secondary | ICD-10-CM | POA: Diagnosis not present

## 2020-01-15 DIAGNOSIS — L0231 Cutaneous abscess of buttock: Secondary | ICD-10-CM

## 2020-01-15 DIAGNOSIS — I10 Essential (primary) hypertension: Secondary | ICD-10-CM

## 2020-01-15 DIAGNOSIS — R531 Weakness: Secondary | ICD-10-CM | POA: Diagnosis not present

## 2020-01-15 DIAGNOSIS — L03317 Cellulitis of buttock: Secondary | ICD-10-CM | POA: Diagnosis not present

## 2020-01-15 DIAGNOSIS — R9431 Abnormal electrocardiogram [ECG] [EKG]: Secondary | ICD-10-CM | POA: Diagnosis not present

## 2020-01-15 DIAGNOSIS — B9562 Methicillin resistant Staphylococcus aureus infection as the cause of diseases classified elsewhere: Secondary | ICD-10-CM | POA: Diagnosis not present

## 2020-01-15 HISTORY — DX: Cutaneous abscess of buttock: L02.31

## 2020-01-15 LAB — CBC WITH DIFFERENTIAL/PLATELET
Abs Immature Granulocytes: 0.18 10*3/uL — ABNORMAL HIGH (ref 0.00–0.07)
Basophils Absolute: 0.1 10*3/uL (ref 0.0–0.1)
Basophils Relative: 0 %
Eosinophils Absolute: 0.1 10*3/uL (ref 0.0–0.5)
Eosinophils Relative: 0 %
HCT: 32.5 % — ABNORMAL LOW (ref 36.0–46.0)
Hemoglobin: 10.5 g/dL — ABNORMAL LOW (ref 12.0–15.0)
Immature Granulocytes: 1 %
Lymphocytes Relative: 10 %
Lymphs Abs: 2.5 10*3/uL (ref 0.7–4.0)
MCH: 29.5 pg (ref 26.0–34.0)
MCHC: 32.3 g/dL (ref 30.0–36.0)
MCV: 91.3 fL (ref 80.0–100.0)
Monocytes Absolute: 1.8 10*3/uL — ABNORMAL HIGH (ref 0.1–1.0)
Monocytes Relative: 7 %
Neutro Abs: 20.8 10*3/uL — ABNORMAL HIGH (ref 1.7–7.7)
Neutrophils Relative %: 82 %
Platelets: 285 10*3/uL (ref 150–400)
RBC: 3.56 MIL/uL — ABNORMAL LOW (ref 3.87–5.11)
RDW: 13.6 % (ref 11.5–15.5)
Smear Review: NORMAL
WBC: 25.4 10*3/uL — ABNORMAL HIGH (ref 4.0–10.5)
nRBC: 0 % (ref 0.0–0.2)

## 2020-01-15 LAB — LACTIC ACID, PLASMA
Lactic Acid, Venous: 1 mmol/L (ref 0.5–1.9)
Lactic Acid, Venous: 1.8 mmol/L (ref 0.5–1.9)

## 2020-01-15 LAB — CBC
HCT: 28.7 % — ABNORMAL LOW (ref 36.0–46.0)
Hemoglobin: 9.1 g/dL — ABNORMAL LOW (ref 12.0–15.0)
MCH: 29.8 pg (ref 26.0–34.0)
MCHC: 31.7 g/dL (ref 30.0–36.0)
MCV: 94.1 fL (ref 80.0–100.0)
Platelets: 240 10*3/uL (ref 150–400)
RBC: 3.05 MIL/uL — ABNORMAL LOW (ref 3.87–5.11)
RDW: 13.7 % (ref 11.5–15.5)
WBC: 21.8 10*3/uL — ABNORMAL HIGH (ref 4.0–10.5)
nRBC: 0 % (ref 0.0–0.2)

## 2020-01-15 LAB — COMPREHENSIVE METABOLIC PANEL
ALT: 14 U/L (ref 0–44)
AST: 18 U/L (ref 15–41)
Albumin: 4.4 g/dL (ref 3.5–5.0)
Alkaline Phosphatase: 80 U/L (ref 38–126)
Anion gap: 11 (ref 5–15)
BUN: 11 mg/dL (ref 6–20)
CO2: 23 mmol/L (ref 22–32)
Calcium: 9.2 mg/dL (ref 8.9–10.3)
Chloride: 103 mmol/L (ref 98–111)
Creatinine, Ser: 0.88 mg/dL (ref 0.44–1.00)
GFR calc Af Amer: >60 mL/min (ref >60)
GFR calc non Af Amer: >60 mL/min (ref >60)
Glucose, Bld: 108 mg/dL — ABNORMAL HIGH (ref 70–99)
Potassium: 2.9 mmol/L — ABNORMAL LOW (ref 3.5–5.1)
Sodium: 137 mmol/L (ref 135–145)
Total Bilirubin: 1.2 mg/dL (ref 0.3–1.2)
Total Protein: 8.2 g/dL — ABNORMAL HIGH (ref 6.5–8.1)

## 2020-01-15 LAB — MAGNESIUM: Magnesium: 2 mg/dL (ref 1.7–2.4)

## 2020-01-15 LAB — HIV ANTIBODY (ROUTINE TESTING W REFLEX): HIV Screen 4th Generation wRfx: NONREACTIVE

## 2020-01-15 LAB — SARS CORONAVIRUS 2 (TAT 6-24 HRS): SARS Coronavirus 2: NEGATIVE

## 2020-01-15 MED ORDER — ENOXAPARIN SODIUM 40 MG/0.4ML ~~LOC~~ SOLN
40.0000 mg | SUBCUTANEOUS | Status: DC
  Administered 2020-01-15: 40 mg via SUBCUTANEOUS
  Filled 2020-01-15 (×2): qty 0.4

## 2020-01-15 MED ORDER — SODIUM CHLORIDE 0.9 % IV SOLN
2.0000 g | Freq: Once | INTRAVENOUS | Status: AC
  Administered 2020-01-15: 2 g via INTRAVENOUS
  Filled 2020-01-15: qty 2

## 2020-01-15 MED ORDER — VANCOMYCIN HCL IN DEXTROSE 750-5 MG/150ML-% IV SOLN
750.0000 mg | Freq: Two times a day (BID) | INTRAVENOUS | Status: DC
  Administered 2020-01-16 – 2020-01-17 (×4): 750 mg via INTRAVENOUS
  Filled 2020-01-15 (×6): qty 150

## 2020-01-15 MED ORDER — SODIUM CHLORIDE 0.9 % IV SOLN
2.0000 g | Freq: Three times a day (TID) | INTRAVENOUS | Status: DC
  Administered 2020-01-15 – 2020-01-17 (×6): 2 g via INTRAVENOUS
  Filled 2020-01-15 (×8): qty 2

## 2020-01-15 MED ORDER — POTASSIUM CHLORIDE CRYS ER 20 MEQ PO TBCR
40.0000 meq | EXTENDED_RELEASE_TABLET | Freq: Two times a day (BID) | ORAL | Status: AC
  Administered 2020-01-15 (×2): 40 meq via ORAL
  Filled 2020-01-15 (×2): qty 2

## 2020-01-15 MED ORDER — METRONIDAZOLE IN NACL 5-0.79 MG/ML-% IV SOLN
500.0000 mg | Freq: Once | INTRAVENOUS | Status: AC
  Administered 2020-01-15: 500 mg via INTRAVENOUS
  Filled 2020-01-15: qty 100

## 2020-01-15 MED ORDER — SODIUM CHLORIDE 0.9 % IV SOLN: INTRAVENOUS | Status: DC

## 2020-01-15 MED ORDER — ENSURE ENLIVE PO LIQD
237.0000 mL | ORAL | Status: DC
  Administered 2020-01-16: 237 mL via ORAL

## 2020-01-15 MED ORDER — LACTATED RINGERS IV BOLUS (SEPSIS)
1000.0000 mL | Freq: Once | INTRAVENOUS | Status: AC
  Administered 2020-01-15: 1000 mL via INTRAVENOUS

## 2020-01-15 MED ORDER — VANCOMYCIN HCL IN DEXTROSE 1-5 GM/200ML-% IV SOLN
1000.0000 mg | Freq: Once | INTRAVENOUS | Status: AC
  Administered 2020-01-15: 1000 mg via INTRAVENOUS
  Filled 2020-01-15: qty 200

## 2020-01-15 MED ORDER — MORPHINE SULFATE (PF) 4 MG/ML IV SOLN: 4.0000 mg | INTRAVENOUS | Status: DC | PRN

## 2020-01-15 MED ORDER — VANCOMYCIN HCL 500 MG IV SOLR
500.0000 mg | Freq: Once | INTRAVENOUS | Status: AC
  Administered 2020-01-15: 500 mg via INTRAVENOUS
  Filled 2020-01-15: qty 500

## 2020-01-15 MED ORDER — LIDOCAINE-EPINEPHRINE 2 %-1:100000 IJ SOLN
30.0000 mL | Freq: Once | INTRAMUSCULAR | Status: AC
  Administered 2020-01-15: 30 mL via INTRADERMAL
  Filled 2020-01-15: qty 2

## 2020-01-15 MED ORDER — KETOROLAC TROMETHAMINE 30 MG/ML IJ SOLN: 30.0000 mg | Freq: Four times a day (QID) | INTRAMUSCULAR | Status: DC | PRN

## 2020-01-15 MED ORDER — LACTATED RINGERS IV BOLUS (SEPSIS)
500.0000 mL | Freq: Once | INTRAVENOUS | Status: AC
  Administered 2020-01-15: 500 mL via INTRAVENOUS

## 2020-01-15 MED ORDER — ONDANSETRON HCL 4 MG/2ML IJ SOLN: 4.0000 mg | Freq: Once | INTRAMUSCULAR | Status: DC

## 2020-01-15 MED ORDER — LACTATED RINGERS IV BOLUS (SEPSIS)
250.0000 mL | Freq: Once | INTRAVENOUS | Status: AC
  Administered 2020-01-15: 250 mL via INTRAVENOUS

## 2020-01-15 NOTE — Progress Notes (Signed)
This Clinical research associate entered patients room to restart IV fluids, I knocked before entering , as patients " Boyfriend' in room with her noted to be standing at the sink and proceed to grab some pills from a [employer] bottle in which he flung into his mouth with water. Advised him that he cannot do that in front of patient or have prescription medications that he could possibly give to patient. Boyfriend also in room without a mask on , asked him to put one on for patient protection ,. He was also noted to be changing his pants , had jeans in his hand and jogging pants on . Appears to be intoxicated. Told charge nurse of incident

## 2020-01-15 NOTE — Consult Note (Signed)
Pharmacy Antibiotic Note  Briana Ortega is a 24 y.o. female admitted on 01/15/2020 with cellulitis.  Pharmacy has been consulted for Vancomycin dosing. Recent evaluation in the ER just 2 days ago for soft tissue infection and sepsis criteria left AMA and is represented the ER for worsening discomfort chills feeling weak actually almost felt like she was having "seizure "symptoms Patient states abscess to R buttocks and denies drainage.    Recent abx for cellulitis: keflex and doxycycline  Vancomycin 1g on 3/4 @0130  and Vancomycin 1g @ 1126. Given renal function and age, the initial vancomycin dose was cleared.   Plan: 1. Will order Vancomycin 500 mg x1 dose for a total of 1500 mg.  Vancomycin 750 mg Q12H at 0000. AUC goal 400-550 Expected AUC 462 Expected Cssmin 12.5  Height: 5\' 6"  (167.6 cm) Weight: 125 lb (56.7 kg) IBW/kg (Calculated) : 59.3  Temp (24hrs), Avg:98.7 F (37.1 C), Min:98.7 F (37.1 C), Max:98.7 F (37.1 C)  Recent Labs  Lab 01/13/20 2202 01/14/20 0003 01/15/20 0905 01/15/20 1004  WBC 18.3*  --  25.4*  --   CREATININE 0.83  --  0.88  --   LATICACIDVEN 1.1 1.0  --  1.0    Estimated Creatinine Clearance: 90.5 mL/min (by C-G formula based on SCr of 0.88 mg/dL).    No Known Allergies  Antimicrobials this admission:   Dose adjustments this admission:  Microbiology results:   Thank you for allowing pharmacy to be a part of this patient's care.  03/16/20 01/15/2020 11:17 AM

## 2020-01-15 NOTE — Progress Notes (Signed)
PHARMACY NOTE:  ANTIMICROBIAL RENAL DOSAGE ADJUSTMENT  Current antimicrobial regimen includes a mismatch between antimicrobial dosage and estimated renal function.  As per policy approved by the Pharmacy & Therapeutics and Medical Executive Committees, the antimicrobial dosage will be adjusted accordingly.  Current antimicrobial dosage:  Cefepime 2g IV every 12 hours  Indication: Sepsis,  Peri rectal abscess  Renal Function: Estimated Creatinine Clearance: 90.5 mL/min (by C-G formula based on SCr of 0.88 mg/dL).  Antimicrobial dosage has been changed to:  Every 8 hours for CrCl >53ml.min   Additional comments:  Thank you for allowing pharmacy to be a part of this patient's care.  Gardner Candle, PharmD, BCPS Clinical Pharmacist 01/15/2020 1:10 PM

## 2020-01-15 NOTE — ED Provider Notes (Signed)
[facility] Emergency Department Provider Note    First MD Initiated Contact with Patient 01/15/20 [phone]     (approximate)  I have reviewed the triage vital signs and the nursing notes.   HISTORY  Chief Complaint Buttock pain   HPI Elasha Tess is a 24 y.o. female with recent evaluation in the ER just 2 days ago for soft tissue infection and sepsis criteria left AMA and is represented the ER for worsening discomfort chills feeling weak actually almost felt like she was having "seizure "symptoms.  Patient states that she was conscious during this episode but felt that she was shaking.  Denies any history of seizure disorder.  States that the area that was causing her pain is becoming more firm.  Not having any drainage.  States that she has been taking the antibiotics but does not feel like she is getting better.  States that she has been able to arrange childcare which is the reason she left previously.    Past Medical History:  Diagnosis Date  . Chlamydia   . GC (gonococcus infection)    Family History  Problem Relation Age of Onset  . Cancer Maternal Grandmother        COLON  . Cancer Maternal Grandfather        COLON  . Cancer Other        COLON   Past Surgical History:  Procedure Laterality Date  . CESAREAN SECTION N/A 05/30/2016   Procedure: CESAREAN SECTION;  Surgeon: Nadara Mustard, MD;  Location: [facility] ORS;  Service: Obstetrics;  Laterality: N/A;  . TOOTH EXTRACTION     Patient Active Problem List   Diagnosis Date Noted  . Postpartum care following cesarean delivery 06/01/2016  . Normal labor and delivery 05/29/2016  . Abdominal pain affecting pregnancy 05/08/2016  . Indication for care in labor and delivery, antepartum 05/08/2016  . Chlamydia trachomatis infection 07/03/2013  . Gonorrhea in female 07/03/2013  . Vaginal discharge 07/01/2013  . BV (bacterial vaginosis) 07/01/2013  . Sexually active at young age 82/20/2014  . Neck  pain 02/24/2013  . Well child check 01/11/2012      Prior to Admission medications   Medication Sig Start Date End Date Taking? Authorizing Provider  cephALEXin (KEFLEX) 500 MG capsule Take 1 capsule (500 mg total) by mouth 4 (four) times daily for 10 days. 01/14/20 01/24/20  Loleta Rose, MD  doxycycline (VIBRAMYCIN) 100 MG capsule Take 1 capsule (100 mg total) by mouth 2 (two) times daily for 10 days. 01/14/20 01/24/20  Loleta Rose, MD  Norethindrone Acetate-Ethinyl Estrad-FE (MICROGESTIN 24 FE) 1-20 MG-MCG(24) tablet Take 1 tablet by mouth daily. Patient not taking: Reported on 01/14/2020 07/13/19   Copland, Ilona Sorrel, PA-C    Allergies Patient has no known allergies.    Social History Social History   Tobacco Use  . Smoking status: Never Smoker  . Smokeless tobacco: Never Used  Substance Use Topics  . Alcohol use: No  . Drug use: No    Review of Systems Patient denies headaches, rhinorrhea, blurry vision, numbness, shortness of breath, chest pain, edema, cough, abdominal pain, nausea, vomiting, diarrhea, dysuria, fevers, rashes or hallucinations unless otherwise stated above in HPI. ____________________________________________   PHYSICAL EXAM:  VITAL SIGNS: Vitals:   01/15/20 0856  BP: (!) 152/85  Pulse: (!) 111  Resp: 20  Temp: 98.7 F (37.1 C)  SpO2: 99%    Constitutional: Alert and oriented.  Eyes: Conjunctivae are normal.  Head: Atraumatic.  Nose: No congestion/rhinnorhea. Mouth/Throat: Mucous membranes are moist.   Neck: No stridor. Painless ROM.  Cardiovascular: Normal rate, regular rhythm. Grossly normal heart sounds.  Good peripheral circulation. Respiratory: Normal respiratory effort.  No retractions. Lungs CTAB. Gastrointestinal: Soft and nontender. No distention. No abdominal bruits. No CVA tenderness. Genitourinary: Right buttock with a 4 cm x 6 cm fluctuant abscess without drainage. Musculoskeletal: No lower extremity tenderness nor edema.  No joint  effusions. Neurologic:  Normal speech and language. No gross focal neurologic deficits are appreciated. No facial droop Skin:  Skin is warm, dry and intact. No rash noted. Psychiatric: Mood and affect are normal. Speech and behavior are normal.  ____________________________________________   LABS (all labs ordered are listed, but only abnormal results are displayed)  Results for orders placed or performed during the hospital encounter of 01/15/20 (from the past 24 hour(s))  CBC with Differential/Platelet     Status: Abnormal   Collection Time: 01/15/20  9:05 AM  Result Value Ref Range   WBC 25.4 (H) 4.0 - 10.5 K/uL   RBC 3.56 (L) 3.87 - 5.11 MIL/uL   Hemoglobin 10.5 (L) 12.0 - 15.0 g/dL   HCT 50.9 (L) 32.6 - 71.2 %   MCV 91.3 80.0 - 100.0 fL   MCH 29.5 26.0 - 34.0 pg   MCHC 32.3 30.0 - 36.0 g/dL   RDW 45.8 09.9 - 83.3 %   Platelets 285 150 - 400 K/uL   nRBC 0.0 0.0 - 0.2 %   Neutrophils Relative % 82 %   Neutro Abs 20.8 (H) 1.7 - 7.7 K/uL   Lymphocytes Relative 10 %   Lymphs Abs 2.5 0.7 - 4.0 K/uL   Monocytes Relative 7 %   Monocytes Absolute 1.8 (H) 0.1 - 1.0 K/uL   Eosinophils Relative 0 %   Eosinophils Absolute 0.1 0.0 - 0.5 K/uL   Basophils Relative 0 %   Basophils Absolute 0.1 0.0 - 0.1 K/uL   WBC Morphology MORPHOLOGY UNREMARKABLE    RBC Morphology MORPHOLOGY UNREMARKABLE    Smear Review Normal platelet morphology    Immature Granulocytes 1 %   Abs Immature Granulocytes 0.18 (H) 0.00 - 0.07 K/uL  Comprehensive metabolic panel     Status: Abnormal   Collection Time: 01/15/20  9:05 AM  Result Value Ref Range   Sodium 137 135 - 145 mmol/L   Potassium 2.9 (L) 3.5 - 5.1 mmol/L   Chloride 103 98 - 111 mmol/L   CO2 23 22 - 32 mmol/L   Glucose, Bld 108 (H) 70 - 99 mg/dL   BUN 11 6 - 20 mg/dL   Creatinine, Ser 8.25 0.44 - 1.00 mg/dL   Calcium 9.2 8.9 - 05.3 mg/dL   Total Protein 8.2 (H) 6.5 - 8.1 g/dL   Albumin 4.4 3.5 - 5.0 g/dL   AST 18 15 - 41 U/L   ALT 14 0 - 44  U/L   Alkaline Phosphatase 80 38 - 126 U/L   Total Bilirubin 1.2 0.3 - 1.2 mg/dL   GFR calc non Af Amer >60 >60 mL/min   GFR calc Af Amer >60 >60 mL/min   Anion gap 11 5 - 15  Lactic acid, plasma     Status: None   Collection Time: 01/15/20 10:04 AM  Result Value Ref Range   Lactic Acid, Venous 1.0 0.5 - 1.9 mmol/L   ____________________________________________ ____________________________________________  RADIOLOGY   ____________________________________________   PROCEDURES  Procedure(s) performed:  Marland KitchenMarland KitchenIncision and Drainage  Date/Time: 01/15/2020 10:31 AM Performed  by: Merlyn Lot, MD Authorized by: Merlyn Lot, MD   Consent:    Consent obtained:  Verbal   Consent given by:  Patient   Risks discussed:  Bleeding, infection, incomplete drainage and pain   Alternatives discussed:  Alternative treatment, delayed treatment and observation Location:    Type:  Abscess Anesthesia (see MAR for exact dosages):    Anesthesia method:  Local infiltration   Local anesthetic:  Lidocaine 1% w/o epi Procedure type:    Complexity:  Complex Procedure details:    Incision types:  Single straight   Incision depth:  Subcutaneous   Scalpel blade:  11   Wound management:  Probed and deloculated   Drainage:  Purulent   Drainage amount:  Moderate   Wound treatment:  Drain placed   Packing materials:  1/4 in iodoform gauze Post-procedure details:    Patient tolerance of procedure:  Tolerated well, no immediate complications  .Critical Care Performed by: Merlyn Lot, MD Authorized by: Merlyn Lot, MD   Critical care provider statement:    Critical care time (minutes):  35   Critical care time was exclusive of:  Separately billable procedures and treating other patients   Critical care was time spent personally by me on the following activities:  Development of treatment plan with patient or surrogate, discussions with consultants, evaluation of patient's response  to treatment, examination of patient, obtaining history from patient or surrogate, ordering and performing treatments and interventions, ordering and review of laboratory studies, ordering and review of radiographic studies, pulse oximetry, re-evaluation of patient's condition and review of old charts      Critical Care performed: yes  ____________________________________________   INITIAL IMPRESSION / ASSESSMENT AND PLAN / ED COURSE  Pertinent labs & imaging results that were available during my care of the patient were reviewed by me and considered in my medical decision making (see chart for details).   DDX: abscess, cellulitis, sepsis, dehydration, electrolyte abn, fistula  Lawrence Roldan is a 24 y.o. who presents to the ED with intense as described above with worsening right buttock abscess and cellulitis.  Antibiotics not improving.  Patient presents tachycardic but not febrile.  She is having chills at home.  White count is increasing.  Does have more developed fluctuant mass feels consistent with abscess which was I indeed with her consent.  Moderate amount of purulent material drained.  Wound left with iodoform gauze strip.  She was septic and is failing to improve on outpatient antibiotics will discuss with hospitalist for observation for IV antibiotics IV fluids and reassessment.     The patient was evaluated in Emergency Department today for the symptoms described in the history of present illness. He/she was evaluated in the context of the global COVID-19 pandemic, which necessitated consideration that the patient might be at risk for infection with the SARS-CoV-2 virus that causes COVID-19. Institutional protocols and algorithms that pertain to the evaluation of patients at risk for COVID-19 are in a state of rapid change based on information released by regulatory bodies including the CDC and federal and state organizations. These policies and algorithms were followed during the  patient's care in the ED.  As part of my medical decision making, I reviewed the following data within the [place] notes reviewed and incorporated, Labs reviewed, notes from prior ED visits and [place] Controlled Substance Database   ____________________________________________   FINAL CLINICAL IMPRESSION(S) / ED DIAGNOSES  Final diagnoses:  Abscess of buttock, right  Cellulitis of  buttock  Sepsis, due to unspecified organism, unspecified whether acute organ dysfunction present [facility])      NEW MEDICATIONS STARTED DURING THIS VISIT:  New Prescriptions   No medications on file     Note:  This document was prepared using Dragon voice recognition software and may include unintentional dictation errors.    Willy Eddy, MD 01/15/20 1049

## 2020-01-15 NOTE — Consult Note (Signed)
[facility] SURGICAL CONSULTATION NOTE (initial) - cpt: 53614   HISTORY OF PRESENT ILLNESS (HPI):  24 y.o. female who originally presented to the ED overnight on 03/03 of pain and swelling to the right buttocks which had been progressively worsening over a few days. At that time, she was found to have a leukocytosis to 18K, febrile to 103, and there was concern peri-rectal cellulitis without drainable abscess on CT. She was to be admitted to the hospitalist for IV Abx with surgery consulting however she left the hospital AMA given child care issues. She was given a prescription for Keflex and Doxycycline as well prior to leaving AMA. She presented to the ED again this morning secondary to feeling worse. She felt increasingly "shaky" this morning and had worsening pain in her right buttock. She endorsed associated chills at home as well. No fever, cough, congestion, CP, or SOB. She also felt the area has been draining more. Work up in the ED showed worsening leukocytosis to 25K (18K on 03/03) and hypokalemia. (2.9). EDP did preform I&D and was able to express purulence. She was admitted to medicine for management of sepsis presumably from gluteal cellulitis and abscess.   Surgery is consulted by emergency medicine physician Dr. Willy Eddy, MD in this context for evaluation and management of right gluteal cellulitis and abscess.   PAST MEDICAL HISTORY (PMH):  Past Medical History:  Diagnosis Date  . Chlamydia   . GC (gonococcus infection)      PAST SURGICAL HISTORY (PSH):  Past Surgical History:  Procedure Laterality Date  . CESAREAN SECTION N/A 05/30/2016   Procedure: CESAREAN SECTION;  Surgeon: Nadara Mustard, MD;  Location: [facility] ORS;  Service: Obstetrics;  Laterality: N/A;  . TOOTH EXTRACTION       MEDICATIONS:  Prior to Admission medications   Medication Sig Start Date End Date Taking? Authorizing Provider  cephALEXin (KEFLEX) 500 MG capsule Take 1 capsule (500 mg  total) by mouth 4 (four) times daily for 10 days. 01/14/20 01/24/20  Loleta Rose, MD  doxycycline (VIBRAMYCIN) 100 MG capsule Take 1 capsule (100 mg total) by mouth 2 (two) times daily for 10 days. 01/14/20 01/24/20  Loleta Rose, MD  Norethindrone Acetate-Ethinyl Estrad-FE (MICROGESTIN 24 FE) 1-20 MG-MCG(24) tablet Take 1 tablet by mouth daily. Patient not taking: Reported on 01/14/2020 07/13/19   Copland, Ilona Sorrel, PA-C     ALLERGIES:  No Known Allergies   SOCIAL HISTORY:  Social History   Socioeconomic History  . Marital status: Single    Spouse name: Not on file  . Number of children: Not on file  . Years of education: Not on file  . Highest education level: Not on file  Occupational History  . Not on file  Tobacco Use  . Smoking status: Never Smoker  . Smokeless tobacco: Never Used  Substance and Sexual Activity  . Alcohol use: No  . Drug use: No  . Sexual activity: Yes    Birth control/protection: Condom  Other Topics Concern  . Not on file  Social History Narrative   ** Merged History Encounter **       Social Determinants of Health   Financial Resource Strain:   . Difficulty of Paying Living Expenses: Not on file  Food Insecurity:   . Worried About Programme researcher, broadcasting/film/video in the Last Year: Not on file  . Ran Out of Food in the Last Year: Not on file  Transportation Needs:   . Lack of Transportation (Medical):  Not on file  . Lack of Transportation (Non-Medical): Not on file  Physical Activity:   . Days of Exercise per Week: Not on file  . Minutes of Exercise per Session: Not on file  Stress:   . Feeling of Stress : Not on file  Social Connections:   . Frequency of Communication with Friends and Family: Not on file  . Frequency of Social Gatherings with Friends and Family: Not on file  . Attends Religious Services: Not on file  . Active Member of Clubs or Organizations: Not on file  . Attends Archivist Meetings: Not on file  . Marital Status: Not on file    Intimate Partner Violence:   . Fear of Current or Ex-Partner: Not on file  . Emotionally Abused: Not on file  . Physically Abused: Not on file  . Sexually Abused: Not on file     FAMILY HISTORY:  Family History  Problem Relation Age of Onset  . Cancer Maternal Grandmother        COLON  . Cancer Maternal Grandfather        COLON  . Cancer Other        COLON      REVIEW OF SYSTEMS:  Review of Systems  Constitutional: Positive for chills. Negative for fever.  HENT: Negative for congestion and sore throat.   Respiratory: Negative for cough and shortness of breath.   Cardiovascular: Negative for chest pain and palpitations.  Gastrointestinal: Negative for abdominal pain, diarrhea, nausea and vomiting.  Skin:       + right gluteal abscess  All other systems reviewed and are negative.   VITAL SIGNS:  Temp:  [98.4 F (36.9 C)-98.7 F (37.1 C)] 98.4 F (36.9 C) (03/05 1150) Pulse Rate:  [83-111] 87 (03/05 1150) Resp:  [16-23] 20 (03/05 1150) BP: (105-152)/(64-85) 112/72 (03/05 1150) SpO2:  [99 %-100 %] 100 % (03/05 1100) Weight:  [56.7 kg] 56.7 kg (03/05 0855)     Height: 5\' 6"  (167.6 cm) Weight: 56.7 kg BMI (Calculated): 20.19   INTAKE/OUTPUT:  No intake/output data recorded.  PHYSICAL EXAM:  Physical Exam Vitals and nursing note reviewed. Exam conducted with a chaperone present.  Constitutional:      General: She is not in acute distress.    Appearance: Normal appearance. She is normal weight. She is not ill-appearing.  HENT:     Head: Normocephalic and atraumatic.  Eyes:     General: No scleral icterus.    Conjunctiva/sclera: Conjunctivae normal.  Pulmonary:     Effort: Pulmonary effort is normal. No respiratory distress.  Genitourinary:    Comments: Deferred Musculoskeletal:        General: Normal range of motion.     Right lower leg: No edema.     Left lower leg: No edema.  Skin:      Neurological:     General: No focal deficit present.     Mental  Status: She is alert and oriented to person, place, and time.  Psychiatric:        Mood and Affect: Mood normal.        Behavior: Behavior normal.      Labs:  CBC Latest Ref Rng & Units 01/15/2020 01/13/2020 05/30/2016  WBC 4.0 - 10.5 K/uL 25.4(H) 18.3(H) 23.5(H)  Hemoglobin 12.0 - 15.0 g/dL 10.5(L) 10.6(L) 10.3(L)  Hematocrit 36.0 - 46.0 % 32.5(L) 32.4(L) 30.4(L)  Platelets 150 - 400 K/uL 285 277 182   CMP Latest Ref Rng & Units  01/15/2020 01/13/2020 07/02/2015  Glucose 70 - 99 mg/dL 469(G) 295(M) 841(L)  BUN 6 - 20 mg/dL 11 11 13   Creatinine 0.44 - 1.00 mg/dL 2.44 0.10  Sodium 135 - 145 mmol/L 137 134(L) 139  Potassium 3.5 - 5.1 mmol/L 2.9(L) 3.0(L) 3.3(L)  Chloride 98 - 111 mmol/L 103 100 107  CO2 22 - 32 mmol/L 23 25 21(L)  Calcium 8.9 - 10.3 mg/dL 9.2 8.9 8.9  Total Protein 6.5 - 8.1 g/dL 8.2(H) 7.7 7.5  Total Bilirubin 0.3 - 1.2 mg/dL 1.2 2.72) 0.9  Alkaline Phos 38 - 126 U/L 80 65 83  AST 15 - 41 U/L 18 18 31   ALT 0 - 44 U/L 14 13 20      Imaging studies:  CT Pelvis (03/03) again reviewed:  IMPRESSION: Cellulitis type pattern extensively throughout the right medial inferior buttock. Slightly more discrete 2 cm low-density area which is either perirectal on the right or adjacent to the right anal verge, which could have been the source of the infection. This could be a small drainable collection, but the majority of the inflammatory change looks more like cellulitis.   Assessment/Plan: (ICD-10's: L02.31) 24 y.o. female with right inferior medial gluteal cellulitis and abscess s/p I&D by EDP on 03/05   - No further surgical intervention; we will follow and continue to reassess need for additional drainage   - Plan on bedside dressing change tomorrow  - Continue IV Abx (Vancomycin and Cefepime)  - Pain control prn   - Further management per primary service  All of the above findings and recommendations were discussed with the patient, and all of patient's questions  were answered to her expressed satisfaction.  Thank you for the opportunity to participate in this patient's care.   -- 1972, PA-C  [facility] 01/15/2020, 11:55 AM [phone] M-F: 7am - 4pm

## 2020-01-15 NOTE — Consult Note (Signed)
PHARMACY -  BRIEF ANTIBIOTIC NOTE   Pharmacy has received consult(s) for Cefepime and Vancomycin  from an ED provider.  The patient's profile has been reviewed for ht/wt/allergies/indication/available labs.    One time order(s) placed for Vancomycin 1g IV and Cefepime 2g  Further antibiotics/pharmacy consults should be ordered by admitting physician if indicated.                       Thank you, Gardner Candle, PharmD, BCPS Clinical Pharmacist 01/15/2020 9:42 AM

## 2020-01-15 NOTE — H&P (Signed)
History and Physical    Briana Ortega DQQ:[id number] DOB: [birth year] DOA: 01/15/2020  PCP: Patient, No Pcp Per   Patient coming from: Home  I have personally briefly reviewed patient's old medical records in [facility]  Chief Complaint: Buttock pain  HPI: Briana Ortega is a 24 y.o. female with no significant past medical history entered to the emergency room for evaluation of buttock pain involving the right buttock.  Patient was seen in the emergency room 2 days ago for soft tissue infection but signed out AGAINST MEDICAL ADVICE because she had to arrange for childcare.  She presents today due  to worsening pain in the right buttock associated with fever and chills.  She has been on 2 antibiotics, Augmentin and doxycycline without any improvement.  She denies having any chest pain, shortness of breath, nausea, vomiting or diaphoresis.  She denies having any abdominal pain, urinary frequency, nocturia or dysuria.  She has no changes in her bowel habits.  She was tachycardic in the emergency room with heart rate of 111 and her blood pressure was elevated.  She has worsening leukocytosis and her white count was 18,002 days ago it is now 25,000.  She also has hypokalemia.  ED Course: She was seen in the emergency room for cellulitis and abscess involving the right buttock.  She is status post incision and drainage of right buttock abscess done by the emergency room provider  Review of Systems: As per HPI otherwise 10 point review of systems negative.    Past Medical History:  Diagnosis Date  . Chlamydia   . GC (gonococcus infection)     Past Surgical History:  Procedure Laterality Date  . CESAREAN SECTION N/A 05/30/2016   Procedure: CESAREAN SECTION;  Surgeon: Nadara Mustard, MD;  Location: [facility] ORS;  Service: Obstetrics;  Laterality: N/A;  . TOOTH EXTRACTION       reports that she has never smoked. She has never used smokeless tobacco. She reports that she does not drink  alcohol or use drugs.  No Known Allergies  Family History  Problem Relation Age of Onset  . Cancer Maternal Grandmother        COLON  . Cancer Maternal Grandfather        COLON  . Cancer Other        COLON     Prior to Admission medications   Medication Sig Start Date End Date Taking? Authorizing Provider  cephALEXin (KEFLEX) 500 MG capsule Take 1 capsule (500 mg total) by mouth 4 (four) times daily for 10 days. 01/14/20 01/24/20  Loleta Rose, MD  doxycycline (VIBRAMYCIN) 100 MG capsule Take 1 capsule (100 mg total) by mouth 2 (two) times daily for 10 days. 01/14/20 01/24/20  Loleta Rose, MD  Norethindrone Acetate-Ethinyl Estrad-FE (MICROGESTIN 24 FE) 1-20 MG-MCG(24) tablet Take 1 tablet by mouth daily. Patient not taking: Reported on 01/14/2020 07/13/19   Trenda Moots    Physical Exam: Vitals:   01/15/20 0856 01/15/20 0930 01/15/20 1000 01/15/20 1100  BP: (!) 152/85 118/84 105/64 107/64  Pulse: (!) 111 92 88 83  Resp: 20 16 17  (!) 23  Temp: 98.7 F (37.1 C)     TempSrc: Oral     SpO2: 99% 99% 100% 100%  Weight:      Height:         Vitals:   01/15/20 0856 01/15/20 0930 01/15/20 1000 01/15/20 1100  BP: (!) 152/85 118/84 105/64 107/64  Pulse: (!) 111 92 88 83  Resp: 20 16 17  (!) 23  Temp: 98.7 F (37.1 C)     TempSrc: Oral     SpO2: 99% 99% 100% 100%  Weight:      Height:        Constitutional: NAD, alert and oriented to person place and time Eyes: PERRL, lids and conjunctivae normal ENMT: Mucous membranes are moist.  Neck: normal, supple, no masses, no thyromegaly Respiratory: clear to auscultation bilaterally, no wheezing, no crackles. Normal respiratory effort. No accessory muscle use.  Cardiovascular: Tachycardic, no murmurs / rubs / gallops. No extremity edema. 2+ pedal pulses. No carotid bruits.  Abdomen: no tenderness, no masses palpated. No hepatosplenomegaly. Bowel sounds positive.  Musculoskeletal: no clubbing / cyanosis. No joint deformity  upper and lower extremities.  Skin: no rashes, lesions, ulcers.  Indurated area over right buttock with differential warmth Neurologic: No gross focal neurologic deficit. Psychiatric: Normal mood and affect.   Labs on Admission: I have personally reviewed following labs and imaging studies  CBC: Recent Labs  Lab 01/13/20 2202 01/15/20 0905  WBC 18.3* 25.4*  NEUTROABS  --  20.8*  HGB 10.6* 10.5*  HCT 32.4* 32.5*  MCV 89.5 91.3  PLT 277 285   Basic Metabolic Panel: Recent Labs  Lab 01/13/20 2202 01/15/20 0905  NA 134* 137  K 3.0* 2.9*  CL 100 103  CO2 25 23  GLUCOSE 112* 108*  BUN 11 11  CREATININE 0.83 0.88  CALCIUM 8.9 9.2  MG 2.0  --    GFR: Estimated Creatinine Clearance: 90.5 mL/min (by C-G formula based on SCr of 0.88 mg/dL). Liver Function Tests: Recent Labs  Lab 01/13/20 2202 01/15/20 0905  AST 18 18  ALT 13 14  ALKPHOS 65 80  BILITOT 1.6* 1.2  PROT 7.7 8.2*  ALBUMIN 4.3 4.4   No results for input(s): LIPASE, AMYLASE in the last 168 hours. No results for input(s): AMMONIA in the last 168 hours. Coagulation Profile: No results for input(s): INR, PROTIME in the last 168 hours. Cardiac Enzymes: No results for input(s): CKTOTAL, CKMB, CKMBINDEX, TROPONINI in the last 168 hours. BNP (last 3 results) No results for input(s): PROBNP in the last 8760 hours. HbA1C: No results for input(s): HGBA1C in the last 72 hours. CBG: No results for input(s): GLUCAP in the last 168 hours. Lipid Profile: No results for input(s): CHOL, HDL, LDLCALC, TRIG, CHOLHDL, LDLDIRECT in the last 72 hours. Thyroid Function Tests: No results for input(s): TSH, T4TOTAL, FREET4, T3FREE, THYROIDAB in the last 72 hours. Anemia Panel: No results for input(s): VITAMINB12, FOLATE, FERRITIN, TIBC, IRON, RETICCTPCT in the last 72 hours. Urine analysis:    Component Value Date/Time   COLORURINE YELLOW (A) 01/13/2020 2344   APPEARANCEUR HAZY (A) 01/13/2020 2344   APPEARANCEUR Hazy  12/10/2014 1156   LABSPEC 1.008 01/13/2020 2344   LABSPEC 1.028 12/10/2014 1156   PHURINE 6.0 01/13/2020 2344   GLUCOSEU NEGATIVE 01/13/2020 2344   GLUCOSEU Negative 12/10/2014 1156   HGBUR NEGATIVE 01/13/2020 2344   HGBUR negative 02/28/2009 1121   BILIRUBINUR NEGATIVE 01/13/2020 2344   BILIRUBINUR Negative 12/10/2014 1156   KETONESUR NEGATIVE 01/13/2020 2344   PROTEINUR NEGATIVE 01/13/2020 2344   UROBILINOGEN 1.0 07/28/2014 1130   UROBILINOGEN 1.0 02/28/2009 1121   NITRITE NEGATIVE 01/13/2020 2344   LEUKOCYTESUR SMALL (A) 01/13/2020 2344   LEUKOCYTESUR Negative 12/10/2014 1156    Radiological Exams on Admission: CT PELVIS W CONTRAST  Result Date: 01/14/2020 CLINICAL DATA:  Right buttock abscess. EXAM: CT PELVIS WITH CONTRAST  TECHNIQUE: Multidetector CT imaging of the pelvis was performed using the standard protocol following the bolus administration of intravenous contrast. CONTRAST:  187mL OMNIPAQUE IOHEXOL 300 MG/ML  SOLN COMPARISON:  None. FINDINGS: There is a diffuse edematous pattern within the knee medial right buttock affecting primarily the subcutaneous fatty tissues. At the superior edge, there is a 2 cm area that appear slightly more discrete and low density that could actually be drainable fluid. This may be Peri rectal or adjacent to the right anal verge. No deep pelvic extension. The patient does have a small amount of free fluid in the pelvis, but this is often seen in young females. The uterus is self appears normal. No adnexal mass. No other bowel finding the pelvis. No bone abnormality. IMPRESSION: Cellulitis type pattern extensively throughout the right medial inferior buttock. Slightly more discrete 2 cm low-density area which is either perirectal on the right or adjacent to the right anal verge, which could have been the source of the infection. This could be a small drainable collection, but the majority of the inflammatory change looks more like cellulitis. Electronically  Signed   By: Nelson Chimes M.D.   On: 01/14/2020 01:06    EKG: Independently reviewed.  Sinus tachycardia  Assessment/Plan Active Problems:   Cellulitis and abscess of buttock   Hypokalemia   Essential hypertension     Cellulitis and abscess of buttock Patient had incision and drainage in the emergency room Follow-up results of wound culture Due to concerns for MRSA infection, will place patient on IV vancomycin.  Will add cefepime for gram-negative coverage Request surgical consult   Hypokalemia Supplement potassium Obtain magnesium level       DVT prophylaxis: Lovenox Code Status: Full code  Family Communication: Plan of care was discussed with patient at the bedside.  She verbalizes understanding and agrees with the plan Disposition Plan: Back to previous home environment Consults called: Surgery    Jairy Angulo MD [facility]     01/15/2020, 11:41 AM

## 2020-01-15 NOTE — ED Notes (Signed)
Upon this RN and Meagan, RN departure from depside, pt noted to be resting in bed, lights dimmed for patient comfort, seizure pads in place for patient safety, call bell within reach. Pt noted to be resting in bed with NAD noted playing on cell phone at this time. Pt denies further needs.

## 2020-01-15 NOTE — Progress Notes (Signed)
Initial Nutrition Assessment  DOCUMENTATION CODES:   Not applicable  INTERVENTION:  Provide Ensure Enlive po once daily, each supplement provides 350 kcal and 20 grams of protein. Patient prefers strawberry.  NUTRITION DIAGNOSIS:   Inadequate oral intake related to decreased appetite as evidenced by per patient/family report.  GOAL:   Patient will meet greater than or equal to 90% of their needs  MONITOR:   PO intake, Supplement acceptance, Labs, Weight trends, I & O's, Skin  REASON FOR ASSESSMENT:   Malnutrition Screening Tool    ASSESSMENT:   24 year old female admitted with right inferior medial gluteal cellulitis and abscess s/p I&D in ED on 3/5.   Met with patient at bedside. She reports decreased appetite and intake for the last 3 days. She is still hungry but has not been able to eat well. Patient had just been admitted when RD saw her so she had not yet received a meal tray here. Encouraged adequate intake at meals. Patient is amenable to drinking ONS to help meet calorie/protein needs.  Patient reports she may have lost 5 lbs recently but that it is not uncommon for her weight to fluctuate. She reports her UBW is 115-120 lbs. She is currently 56.7 kg (125 lbs) if current weight is accurate.  Medications reviewed and include: Zofran 4 mg once IV, potassium chloride 40 mEq BID, NS at 125 mL/hr, cefepime, vancomycin.  Labs reviewed: Potassium 2.9.  Patient does not meet criteria for malnutrition.  NUTRITION - FOCUSED PHYSICAL EXAM:    Most Recent Value  Orbital Region  No depletion  Upper Arm Region  No depletion  Thoracic and Lumbar Region  No depletion  Buccal Region  No depletion  Temple Region  No depletion  Clavicle Bone Region  No depletion  Clavicle and Acromion Bone Region  No depletion  Scapular Bone Region  No depletion  Dorsal Hand  No depletion  Patellar Region  No depletion  Anterior Thigh Region  No depletion  Posterior Calf Region  No  depletion  Edema (RD Assessment)  None  Hair  Reviewed  Eyes  Reviewed  Mouth  Reviewed  Skin  Reviewed  Nails  Reviewed     Diet Order:   Diet Order            Diet regular Room service appropriate? Yes; Fluid consistency: Thin  Diet effective now             EDUCATION NEEDS:   No education needs have been identified at this time  Skin:  Skin Assessment: Skin Integrity Issues:(abscess to right buttocks s/p I&D)  Last BM:  Unknown  Height:   Ht Readings from Last 1 Encounters:  01/15/20 _0  (1.676 m)   Weight:   Wt Readings from Last 1 Encounters:  01/15/20 56.7 kg   BMI:  Body mass index is 20.18 kg/m.  Estimated Nutritional Needs:   Kcal:  1500-1700  Protein:  75-85 grams  Fluid:  1.7-2 L/day  Jacklynn Barnacle, MS, RD, LDN Pager number available on Amion

## 2020-01-15 NOTE — ED Triage Notes (Signed)
Pt friends brought pt to ED reporting she had a seizure. Pt remember eye rolling back and could still hear but reports could not respond.  Pt denies pain.  Reports on abx for "blood infection".  Pt oriented and answering questions at this time.  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

## 2020-01-15 NOTE — ED Notes (Signed)
Meagan, RN and this RN at bedside, EDP at bedside, I&D performed by EDP. Pt tolerating well at this time. Wound culture obtained by EDP. VORB for wound culture, order placed by this RN.

## 2020-01-15 NOTE — ED Notes (Signed)
Pt presents to ED via POV, pt states abscess to R buttocks that she is on 2 different abx for at this time. Pt states at home max temp 103, reports en route was told by her friend that she was "hot to touch", unsure was max temp at home was today. Pt states she was able to hear what her friend who brought patient to ED was saying she was unable to respond or see, pt states she could feel herself shaking. Upon arrival patient is A&O x4. ST on the monitor. VSS. Seizure pads placed by this RN and Customer service manager, RN.

## 2020-01-16 DIAGNOSIS — L0231 Cutaneous abscess of buttock: Secondary | ICD-10-CM

## 2020-01-16 DIAGNOSIS — I10 Essential (primary) hypertension: Secondary | ICD-10-CM

## 2020-01-16 DIAGNOSIS — L03317 Cellulitis of buttock: Principal | ICD-10-CM

## 2020-01-16 LAB — BASIC METABOLIC PANEL
Anion gap: 11 (ref 5–15)
BUN: <5 mg/dL — ABNORMAL LOW (ref 6–20)
CO2: 24 mmol/L (ref 22–32)
Calcium: 8.7 mg/dL — ABNORMAL LOW (ref 8.9–10.3)
Chloride: 105 mmol/L (ref 98–111)
Creatinine, Ser: 0.65 mg/dL (ref 0.44–1.00)
GFR calc Af Amer: >60 mL/min (ref >60)
GFR calc non Af Amer: >60 mL/min (ref >60)
Glucose, Bld: 88 mg/dL (ref 70–99)
Potassium: 4 mmol/L (ref 3.5–5.1)
Sodium: 140 mmol/L (ref 135–145)

## 2020-01-16 LAB — CBC
HCT: 30 % — ABNORMAL LOW (ref 36.0–46.0)
Hemoglobin: 9.4 g/dL — ABNORMAL LOW (ref 12.0–15.0)
MCH: 29.2 pg (ref 26.0–34.0)
MCHC: 31.3 g/dL (ref 30.0–36.0)
MCV: 93.2 fL (ref 80.0–100.0)
Platelets: 249 10*3/uL (ref 150–400)
RBC: 3.22 MIL/uL — ABNORMAL LOW (ref 3.87–5.11)
RDW: 13.8 % (ref 11.5–15.5)
WBC: 12 10*3/uL — ABNORMAL HIGH (ref 4.0–10.5)
nRBC: 0 % (ref 0.0–0.2)

## 2020-01-16 NOTE — Progress Notes (Signed)
01/16/2020  Subjective: No acute events. Patient reports much improvement in her pain in the right buttocks.  Her WBC significantly improved and is 12 today, down from 21.8 yesterday.  Cultures pending, but showing few staph aureus, with susceptibilities pending.  Vital signs: Temp:  [98.5 F (36.9 C)-99.3 F (37.4 C)] 98.7 F (37.1 C) (03/06 1235) Pulse Rate:  [71-105] 79 (03/06 1235) Resp:  [16-20] 17 (03/06 1235) BP: (97-120)/(50-72) 111/50 (03/06 1235) SpO2:  [96 %-100 %] 100 % (03/06 1235)   Intake/Output: 03/05 0701 - 03/06 0700 In: 2183.3 [I.V.:883.3; IV Piggyback:1300] Out: -     Physical Exam: Constitutional: No acute distress Skin:  Right buttocks I&D site with much improved surrounding induration and erythema.  1/2 inch iodoform gauze packing change done without issues.  Still some purulent drainage, but less than yesterday.  Labs:  Recent Labs    01/15/20 1219 01/16/20 0617  WBC 21.8* 12.0*  HGB 9.1* 9.4*  HCT 28.7* 30.0*  PLT 240 249   Recent Labs    01/15/20 0905 01/16/20 0617  NA 137 140  K 2.9* 4.0  CL 103 105  CO2 23 24  GLUCOSE 108* 88  BUN 11 <5*  CREATININE 0.88 0.65  CALCIUM 9.2 8.7*   No results for input(s): LABPROT, INR in the last 72 hours.  Imaging: No results found.  Assessment/Plan: This is a 24 y.o. female s/p I&D of right buttocks abscess  --Patient is improving today, almost normal WBC, less pain and drainage. --Continue 1/2 inch iodoform packing dressing changes daily.  Outer gauze dressing can be changed as needed as it gets saturated to keep the wound clean and dry. --Potentially can d/c home tomorrow as she is progressing well.   Howie Ill, MD [facility]

## 2020-01-16 NOTE — Progress Notes (Signed)
PROGRESS NOTE    Briana Ortega  VVO:[id number]  DOB: January 02, 1996  DOA: 01/15/2020 PCP: Patient, No Pcp Per Outpatient Specialists:   Hospital course:  24 year old female was admitted 01/15/2020 with buttock abscess and cellulitis.  She underwent I&D in the ED and is admitted for IV antibiotics.  Subjective:  Patient states she feels better.  She is wondering when she can go home.   Objective: Vitals:   01/16/20 0015 01/16/20 0843 01/16/20 1039 01/16/20 1235  BP: 115/69 112/63 (!) 97/57 (!) 111/50  Pulse: 83 71 76 79  Resp: 16 16 16 17   Temp: 99.3 F (37.4 C) 98.6 F (37 C) 98.5 F (36.9 C) 98.7 F (37.1 C)  TempSrc: Oral Oral Oral Oral  SpO2: 96% 100% 100% 100%  Weight:      Height:        Intake/Output Summary (Last 24 hours) at 01/16/2020 1602 Last data filed at 01/16/2020 1411 Gross per 24 hour  Intake 1483.25 ml  Output --  Net 1483.25 ml   Filed Weights   01/15/20 0855  Weight: 56.7 kg     Assessment & Plan:   Buttock abscess and cellulitis That is post I/D in the ER On vancomycin and cefepime Wound cultures pending WBC has decreased from 25-12 Surgery following  Hypokalemia Repleted and normalized   DVT prophylaxis: Lovenox Code Status: Full Family Communication: Patient states no need to call anybody Disposition Plan:   Patient is from: Home  Anticipated Discharge Location: Home  Barriers to Discharge: Wound culture results and ongoing IV antibiotics with wound management by surgery  Is patient medically stable for Discharge: Not yet   Consultants:  General surgery  Procedures:  Status post I&D of buttock abscess  Antimicrobials:  Vancomycin started 01/15/2020  Cefepime started 01/15/2020   Exam:  General: Thin well-appearing female sitting up in bed in no acute distress wondering when she could go home Eyes: sclera anicteric, conjuctiva mild injection bilaterally CVS: S1-S2, regular  Respiratory:  decreased air entry  bilaterally secondary to decreased inspiratory effort, rales at bases  GI: NABS, soft, NT.  Wound covered with CDI. LE: No edema.  Neuro: A/O x 3, Moving all extremities equally with normal strength, CN 3-12 intact, grossly nonfocal.   Data Reviewed: Basic Metabolic Panel: Recent Labs  Lab 01/13/20 2202 01/15/20 0905 01/15/20 1219 01/16/20 0617  NA 134* 137  --  140  K 3.0* 2.9*  --  4.0  CL 100 103  --  105  CO2 25 23  --  24  GLUCOSE 112* 108*  --  88  BUN 11 11  --  <5*  CREATININE 0.83 0.88  --  0.65  CALCIUM 8.9 9.2  --  8.7*  MG 2.0  --  2.0  --    Liver Function Tests: Recent Labs  Lab 01/13/20 2202 01/15/20 0905  AST 18 18  ALT 13 14  ALKPHOS 65 80  BILITOT 1.6* 1.2  PROT 7.7 8.2*  ALBUMIN 4.3 4.4   No results for input(s): LIPASE, AMYLASE in the last 168 hours. No results for input(s): AMMONIA in the last 168 hours. CBC: Recent Labs  Lab 01/13/20 2202 01/15/20 0905 01/15/20 1219 01/16/20 0617  WBC 18.3* 25.4* 21.8* 12.0*  NEUTROABS  --  20.8*  --   --   HGB 10.6* 10.5* 9.1* 9.4*  HCT 32.4* 32.5* 28.7* 30.0*  MCV 89.5 91.3 94.1 93.2  PLT 277 285 240 249   Cardiac Enzymes: No results for  input(s): CKTOTAL, CKMB, CKMBINDEX, TROPONINI in the last 168 hours. BNP (last 3 results) No results for input(s): PROBNP in the last 8760 hours. CBG: No results for input(s): GLUCAP in the last 168 hours.  Recent Results (from the past 240 hour(s))  Blood Culture (routine x 2)     Status: None (Preliminary result)   Collection Time: 01/13/20 10:02 PM   Specimen: BLOOD  Result Value Ref Range Status   Specimen Description BLOOD LEFT [facility]  Final   Special Requests   Final    BOTTLES DRAWN AEROBIC AND ANAEROBIC Blood Culture adequate volume   Culture   Final    NO GROWTH 3 DAYS Performed at [facility], [address], [place], [place] [address]    Report Status PENDING  Incomplete  Blood Culture (routine x 2)     Status: None (Preliminary result)     Collection Time: 01/13/20 11:29 PM   Specimen: BLOOD  Result Value Ref Range Status   Specimen Description BLOOD LEFT FA  Final   Special Requests   Final    BOTTLES DRAWN AEROBIC AND ANAEROBIC Blood Culture adequate volume   Culture   Final    NO GROWTH 3 DAYS Performed at [facility], [address] [place], [place] [address]    Report Status PENDING  Incomplete  Respiratory Panel by RT PCR (Flu A&B, Covid) - Nasopharyngeal Swab     Status: None   Collection Time: 01/13/20 11:29 PM   Specimen: Nasopharyngeal Swab  Result Value Ref Range Status   SARS Coronavirus 2 by RT PCR NEGATIVE NEGATIVE Final    Comment: (NOTE) SARS-CoV-2 target nucleic acids are NOT DETECTED. The SARS-CoV-2 RNA is generally detectable in upper respiratoy specimens during the acute phase of infection. The lowest concentration of SARS-CoV-2 viral copies this assay can detect is 131 copies/mL. A negative result does not preclude SARS-Cov-2 infection and should not be used as the sole basis for treatment or other patient management decisions. A negative result may occur with  improper specimen collection/handling, submission of specimen other than nasopharyngeal swab, presence of viral mutation(s) within the areas targeted by this assay, and inadequate number of viral copies (<131 copies/mL). A negative result must be combined with clinical observations, patient history, and epidemiological information. The expected result is Negative. Fact Sheet for Patients:  https://www.moore.com/ Fact Sheet for Healthcare Providers:  https://www.young.biz/ This test is not yet ap proved or cleared by the [place] FDA and  has been authorized for detection and/or diagnosis of SARS-CoV-2 by FDA under an Emergency Use Authorization (EUA). This EUA will remain  in effect (meaning this test can be used) for the duration of the COVID-19 declaration under Section  564(b)(1) of the Act, 21 U.S.C. section 360bbb-3(b)(1), unless the authorization is terminated or revoked sooner.    Influenza A by PCR NEGATIVE NEGATIVE Final   Influenza B by PCR NEGATIVE NEGATIVE Final    Comment: (NOTE) The Xpert Xpress SARS-CoV-2/FLU/RSV assay is intended as an aid in  the diagnosis of influenza from Nasopharyngeal swab specimens and  should not be used as a sole basis for treatment. Nasal washings and  aspirates are unacceptable for Xpert Xpress SARS-CoV-2/FLU/RSV  testing. Fact Sheet for Patients: https://www.moore.com/ Fact Sheet for Healthcare Providers: https://www.young.biz/ This test is not yet approved or cleared by the [place] FDA and  has been authorized for detection and/or diagnosis of SARS-CoV-2 by  FDA under an Emergency Use Authorization (EUA). This EUA will remain  in effect (meaning this test can be used) for the duration of the  Covid-19 declaration under Section 564(b)(1) of the Act, 21  U.S.C. section 360bbb-3(b)(1), unless the authorization is  terminated or revoked. Performed at [facility], [address] [place], [place] [address]   Blood Culture (routine x 2)     Status: None (Preliminary result)   Collection Time: 01/15/20  9:06 AM   Specimen: BLOOD  Result Value Ref Range Status   Specimen Description BLOOD L AC  Final   Special Requests   Final    BOTTLES DRAWN AEROBIC AND ANAEROBIC Blood Culture adequate volume   Culture   Final    NO GROWTH < 24 HOURS Performed at [facility], [address] [place], [place] [address]    Report Status PENDING  Incomplete  Blood Culture (routine x 2)     Status: None (Preliminary result)   Collection Time: 01/15/20  9:34 AM   Specimen: BLOOD  Result Value Ref Range Status   Specimen Description BLOOD RIGHT ANTECUBITAL  Final   Special Requests   Final    BOTTLES DRAWN AEROBIC AND ANAEROBIC RIGHT ANTECUBITAL   Culture    Final    NO GROWTH < 24 HOURS Performed at [facility], [address], [place], [place] [address]    Report Status PENDING  Incomplete  Wound or Superficial Culture     Status: None (Preliminary result)   Collection Time: 01/15/20 10:24 AM   Specimen: Buttocks; Wound  Result Value Ref Range Status   Specimen Description   Final    BUTTOCKS Performed at [facility], [address], [place], [place] [address]    Special Requests   Final    Normal Performed at [facility], [address] [place], [place] [address]    Gram Stain   Final    RARE WBC PRESENT, PREDOMINANTLY PMN FEW GRAM POSITIVE COCCI IN CLUSTERS    Culture   Final    FEW STAPHYLOCOCCUS AUREUS SUSCEPTIBILITIES TO FOLLOW Performed at [facility], [address] [place], [place] [address]    Report Status PENDING  Incomplete  SARS CORONAVIRUS 2 (TAT 6-24 HRS) Nasopharyngeal Nasopharyngeal Swab     Status: None   Collection Time: 01/15/20 10:37 AM   Specimen: Nasopharyngeal Swab  Result Value Ref Range Status   SARS Coronavirus 2 NEGATIVE NEGATIVE Final    Comment: (NOTE) SARS-CoV-2 target nucleic acids are NOT DETECTED. The SARS-CoV-2 RNA is generally detectable in upper and lower respiratory specimens during the acute phase of infection. Negative results do not preclude SARS-CoV-2 infection, do not rule out co-infections with other pathogens, and should not be used as the sole basis for treatment or other patient management decisions. Negative results must be combined with clinical observations, patient history, and epidemiological information. The expected result is Negative. Fact Sheet for Patients: HairSlick.no Fact Sheet for Healthcare Providers: quierodirigir.com This test is not yet approved or cleared by the [place] FDA and  has been authorized for detection and/or diagnosis of SARS-CoV-2 by FDA under  an Emergency Use Authorization (EUA). This EUA will remain  in effect (meaning this test can be used) for the duration of the COVID-19 declaration under Section 56 4(b)(1) of the Act, 21 U.S.C. section 360bbb-3(b)(1), unless the authorization is terminated or revoked sooner. Performed at [facility], [address], [place], [place] [address]       Studies: No results found.  Scheduled Meds: . enoxaparin (LOVENOX) injection  40 mg Subcutaneous Q24H  . feeding supplement (ENSURE ENLIVE)  237 mL Oral Q24H  . ondansetron (ZOFRAN) IV  4 mg Intravenous Once   Continuous Infusions: . sodium chloride 125 mL/hr at 01/16/20 0830  . ceFEPime (MAXIPIME) IV 2 g (01/16/20 1420)  . vancomycin 750 mg (01/16/20 1055)    Active Problems:   Cellulitis and abscess of buttock   Hypokalemia   Essential hypertension     Avnoor Koury Derek Jack, [facility]  If 7PM-7AM, please contact night-coverage [email or web address] Password [email or web address] 01/16/2020, 4:02 PM    LOS: 1 day

## 2020-01-17 LAB — CBC WITH DIFFERENTIAL/PLATELET
Abs Immature Granulocytes: 0.06 10*3/uL (ref 0.00–0.07)
Basophils Absolute: 0 10*3/uL (ref 0.0–0.1)
Basophils Relative: 1 %
Eosinophils Absolute: 0.1 10*3/uL (ref 0.0–0.5)
Eosinophils Relative: 1 %
HCT: 31.8 % — ABNORMAL LOW (ref 36.0–46.0)
Hemoglobin: 10.1 g/dL — ABNORMAL LOW (ref 12.0–15.0)
Immature Granulocytes: 1 %
Lymphocytes Relative: 25 %
Lymphs Abs: 2.1 10*3/uL (ref 0.7–4.0)
MCH: 28.9 pg (ref 26.0–34.0)
MCHC: 31.8 g/dL (ref 30.0–36.0)
MCV: 90.9 fL (ref 80.0–100.0)
Monocytes Absolute: 0.6 10*3/uL (ref 0.1–1.0)
Monocytes Relative: 7 %
Neutro Abs: 5.5 10*3/uL (ref 1.7–7.7)
Neutrophils Relative %: 65 %
Platelets: 275 10*3/uL (ref 150–400)
RBC: 3.5 MIL/uL — ABNORMAL LOW (ref 3.87–5.11)
RDW: 13.4 % (ref 11.5–15.5)
WBC: 8.3 10*3/uL (ref 4.0–10.5)
nRBC: 0 % (ref 0.0–0.2)

## 2020-01-17 LAB — BASIC METABOLIC PANEL
Anion gap: 10 (ref 5–15)
BUN: 9 mg/dL (ref 6–20)
CO2: 23 mmol/L (ref 22–32)
Calcium: 9.1 mg/dL (ref 8.9–10.3)
Chloride: 105 mmol/L (ref 98–111)
Creatinine, Ser: 0.62 mg/dL (ref 0.44–1.00)
GFR calc Af Amer: >60 mL/min (ref >60)
GFR calc non Af Amer: >60 mL/min (ref >60)
Glucose, Bld: 93 mg/dL (ref 70–99)
Potassium: 3.9 mmol/L (ref 3.5–5.1)
Sodium: 138 mmol/L (ref 135–145)

## 2020-01-17 MED ORDER — CLINDAMYCIN HCL 150 MG PO CAPS: 450.0000 mg | ORAL_CAPSULE | Freq: Three times a day (TID) | ORAL | 0 refills | Status: AC

## 2020-01-17 NOTE — Progress Notes (Signed)
Patient discharging home, instructions given to patient, verbalized understanding.  

## 2020-01-17 NOTE — Discharge Instructions (Signed)
Packing dressing change with 1/2 iodoform gauze once daily and as needed if the packing falls off.  Use tweezers to push the packing strip into the wound, and cut a tail so it's easier to retrieve the next dressing change.  Cover with dry gauze and tape.

## 2020-01-17 NOTE — Plan of Care (Signed)

## 2020-01-17 NOTE — Progress Notes (Signed)
01/17/2020  Subjective: No acute events overnight. Patient reports that her pain is significantly better and continues to improve. Her white blood cell count is normalized now to 8.3.  Vital signs: Temp:  [98.2 F (36.8 C)-98.8 F (37.1 C)] 98.3 F (36.8 C) (03/07 0834) Pulse Rate:  [62-81] 80 (03/07 0834) Resp:  [16-18] 16 (03/07 0010) BP: (98-117)/(50-70) 117/59 (03/07 0834) SpO2:  [100 %] 100 % (03/07 0834)   Intake/Output: 03/06 0701 - 03/07 0700 In: 3289.8 [P.O.:490; I.V.:2099; IV Piggyback:700.7] Out: 2 [Urine:1; Stool:1]    Physical Exam: Constitutional: No acute distress Skin: Right buttocks I&D site still with some purulent drainage upon manual expression but otherwise is healing well without any erythema and without any significant induration. Half-inch iodoform gauze packing dressing changed at bedside without complications.  Labs:  Recent Labs    01/16/20 0617 01/17/20 0527  WBC 12.0* 8.3  HGB 9.4* 10.1*  HCT 30.0* 31.8*  PLT 249 275   Recent Labs    01/16/20 0617 01/17/20 0527  NA 140 138  K 4.0 3.9  CL 105 105  CO2 24 23  GLUCOSE 88 93  BUN <5* 9  CREATININE 0.65 0.62  CALCIUM 8.7* 9.1   No results for input(s): LABPROT, INR in the last 72 hours.  Imaging: No results found.  Assessment/Plan: This is a 24 y.o. female with a right buttocks abscess status post I&D.  -Instructed the patient on doing dressing changes once a day and as needed to keep the area clean and dry particularly if the packing falls off. -Currently cultures are growing MRSA. Would recommend 1 more week of antibiotic management at home. -May discharge home today from a surgical standpoint. -Follow-up in 1 week in our office with Mr. Manus Rudd.   Howie Ill, MD [facility]

## 2020-01-18 LAB — CULTURE, BLOOD (ROUTINE X 2)
Culture: NO GROWTH
Culture: NO GROWTH
Special Requests: ADEQUATE
Special Requests: ADEQUATE

## 2020-01-18 NOTE — Discharge Summary (Signed)
Briana Ortega PTW:[id number] DOB: 1996/10/29 DOA: 01/15/2020  PCP: Patient, No Pcp Per  Admit date: 01/15/2020  Discharge date: 01/18/2020  Admitted From: Home   disposition: Home   Recommendations for Outpatient Follow-up:   Follow up with PCP in 1-2 weeks  Home Health: None Equipment/Devices: None Consultations: Surgery Discharge Condition: Improved CODE STATUS: Full Diet Recommendation: Regular     No chief complaint on file.    Brief history of present illness from the day of admission and additional interim summary    Briana Ortega is a 24 y.o. female with no significant past medical history entered to the emergency room for evaluation of buttock pain involving the right buttock.  Patient was seen in the emergency room 2 days ago for soft tissue infection but signed out [place] because she had to arrange for childcare.  She presents today due  to worsening pain in the right buttock associated with fever and chills.  She has been on 2 antibiotics, Augmentin and doxycycline without any improvement.  She denies having any chest pain, shortness of breath, nausea, vomiting or diaphoresis.  She denies having any abdominal pain, urinary frequency, nocturia or dysuria.  She has no changes in her bowel habits.  She was tachycardic in the emergency room with heart rate of 111 and her blood pressure was elevated.  She has worsening leukocytosis and her white count was 18,002 days ago it is now 25,000.  She also has hypokalemia. She underwent in incision and drainage of right buttock abscess in the ED. she is admitted for IV antibiotics per general surgery recommendations.                                                                 Hospital Course   Patient was admitted for IV antibiotics and was started  on vancomycin and Zosyn pending wound cultures.  General surgery was consulted and they followed her and changed her packing on a daily basis.  She was much improved the day after admission with a decrease in WBC from 25-12.  Patient was very eager to go home.  Patient was seen by general surgery the following day who noted some purulent drainage with manual expression but otherwise healing well without erythema and without induration.  They recommended discharge on oral antibiotics the following day after wound cultures were speciated.  Wound culture revealed MRSA which was sensitive to clindamycin.  Patient was discharged on clindamycin.  She is to follow-up with general surgery in 1 week.  Buttock abscess and cellulitis Patient is post I/D in the ER Treated with vancomycin and cefepime for 24 hours Wound cultures field MRSA sensitive to clindamycin Patient discharged with 1 week of clindamycin. She is to follow-up with general surgery clinic in 1 week. WBC has decreased from  25 to 8. Hypokalemia Repleted and normalized    Discharge diagnosis     Active Problems:   Abscess of buttock, right   Hypokalemia   Essential hypertension    Discharge instructions    Discharge Instructions    Call MD for:  redness, tenderness, or signs of infection (pain, swelling, redness, odor or green/yellow discharge around incision site)   Complete by: As directed    Diet - low sodium heart healthy   Complete by: As directed    Discharge instructions   Complete by: As directed    1.  Take your antibiotic as prescribed until you see Dr. Aleen Campi. 2.  Do not miss any doses of your antibiotics, if you do your abscess might get worse and get more difficult to treat. 3.  You will need to see Dr. Aleen Campi in 1 week.   Increase activity slowly   Complete by: As directed       Discharge Medications   Allergies as of 01/17/2020   No Known Allergies     Medication List    STOP taking these  medications   cephALEXin 500 MG capsule Commonly known as: KEFLEX   doxycycline 100 MG capsule Commonly known as: VIBRAMYCIN     TAKE these medications   clindamycin 150 MG capsule Commonly known as: Cleocin Take 3 capsules (450 mg total) by mouth 3 (three) times daily for 10 days.   Microgestin 24 Fe 1-20 MG-MCG(24) tablet Generic drug: Norethindrone Acetate-Ethinyl Estrad-FE Take 1 tablet by mouth daily.       Follow-up Information    Donovan Kail, PA-C Follow up in 1 week(s).   Specialty: Physician Assistant Why: for wound check. Contact information: [address] [place] [address] [phone]           Major procedures and Radiology Reports - PLEASE review detailed and final reports thoroughly  -      CT PELVIS W CONTRAST  Result Date: 01/14/2020 CLINICAL DATA:  Right buttock abscess. EXAM: CT PELVIS WITH CONTRAST TECHNIQUE: Multidetector CT imaging of the pelvis was performed using the standard protocol following the bolus administration of intravenous contrast. CONTRAST:  OMNIPAQUE IOHEXOL 300 MG/ML  SOLN COMPARISON:  None. FINDINGS: There is a diffuse edematous pattern within the knee medial right buttock affecting primarily the subcutaneous fatty tissues. At the superior edge, there is a 2 cm area that appear slightly more discrete and low density that could actually be drainable fluid. This may be Peri rectal or adjacent to the right anal verge. No deep pelvic extension. The patient does have a small amount of free fluid in the pelvis, but this is often seen in young females. The uterus is self appears normal. No adnexal mass. No other bowel finding the pelvis. No bone abnormality. IMPRESSION: Cellulitis type pattern extensively throughout the right medial inferior buttock. Slightly more discrete 2 cm low-density area which is either perirectal on the right or adjacent to the right anal verge, which could have been the source of the infection.  This could be a small drainable collection, but the majority of the inflammatory change looks more like cellulitis. Electronically Signed   By: Paulina Fusi M.D.   On: 01/14/2020 01:06    Micro Results    Recent Results (from the past 240 hour(s))  Blood Culture (routine x 2)     Status: None   Collection Time: 01/13/20 10:02 PM   Specimen: BLOOD  Result Value Ref Range Status  Specimen Description BLOOD LEFT AC  Final   Special Requests   Final    BOTTLES DRAWN AEROBIC AND ANAEROBIC Blood Culture adequate volume   Culture   Final    NO GROWTH 5 DAYS Performed at [facility], [address] [place], [place] [address]    Report Status 01/18/2020 FINAL  Final  Blood Culture (routine x 2)     Status: None   Collection Time: 01/13/20 11:29 PM   Specimen: BLOOD  Result Value Ref Range Status   Specimen Description BLOOD LEFT FA  Final   Special Requests   Final    BOTTLES DRAWN AEROBIC AND ANAEROBIC Blood Culture adequate volume   Culture   Final    NO GROWTH 5 DAYS Performed at [facility], [address] [place], [place] [address]    Report Status 01/18/2020 FINAL  Final  Respiratory Panel by RT PCR (Flu A&B, Covid) - Nasopharyngeal Swab     Status: None   Collection Time: 01/13/20 11:29 PM   Specimen: Nasopharyngeal Swab  Result Value Ref Range Status   SARS Coronavirus 2 by RT PCR NEGATIVE NEGATIVE Final    Comment: (NOTE) SARS-CoV-2 target nucleic acids are NOT DETECTED. The SARS-CoV-2 RNA is generally detectable in upper respiratoy specimens during the acute phase of infection. The lowest concentration of SARS-CoV-2 viral copies this assay can detect is 131 copies/mL. A negative result does not preclude SARS-Cov-2 infection and should not be used as the sole basis for treatment or other patient management decisions. A negative result may occur with  improper specimen collection/handling, submission of specimen other than nasopharyngeal swab,  presence of viral mutation(s) within the areas targeted by this assay, and inadequate number of viral copies (<131 copies/mL). A negative result must be combined with clinical observations, patient history, and epidemiological information. The expected result is Negative. Fact Sheet for Patients:  https://www.moore.com/ Fact Sheet for Healthcare Providers:  https://www.young.biz/ This test is not yet ap proved or cleared by the [place] FDA and  has been authorized for detection and/or diagnosis of SARS-CoV-2 by FDA under an Emergency Use Authorization (EUA). This EUA will remain  in effect (meaning this test can be used) for the duration of the COVID-19 declaration under Section 564(b)(1) of the Act, 21 U.S.C. section 360bbb-3(b)(1), unless the authorization is terminated or revoked sooner.    Influenza A by PCR NEGATIVE NEGATIVE Final   Influenza B by PCR NEGATIVE NEGATIVE Final    Comment: (NOTE) The Xpert Xpress SARS-CoV-2/FLU/RSV assay is intended as an aid in  the diagnosis of influenza from Nasopharyngeal swab specimens and  should not be used as a sole basis for treatment. Nasal washings and  aspirates are unacceptable for Xpert Xpress SARS-CoV-2/FLU/RSV  testing. Fact Sheet for Patients: https://www.moore.com/ Fact Sheet for Healthcare Providers: https://www.young.biz/ This test is not yet approved or cleared by the [place] FDA and  has been authorized for detection and/or diagnosis of SARS-CoV-2 by  FDA under an Emergency Use Authorization (EUA). This EUA will remain  in effect (meaning this test can be used) for the duration of the  Covid-19 declaration under Section 564(b)(1) of the Act, 21  U.S.C. section 360bbb-3(b)(1), unless the authorization is  terminated or revoked. Performed at [facility], [address] [place], [place] [address]   Blood Culture (routine x 2)      Status: None (Preliminary result)   Collection Time: 01/15/20  9:06 AM   Specimen: BLOOD  Result Value  Ref Range Status   Specimen Description BLOOD L AC  Final   Special Requests   Final    BOTTLES DRAWN AEROBIC AND ANAEROBIC Blood Culture adequate volume   Culture   Final    NO GROWTH 3 DAYS Performed at [facility], [address] [place], [place] [address]    Report Status PENDING  Incomplete  Blood Culture (routine x 2)     Status: None (Preliminary result)   Collection Time: 01/15/20  9:34 AM   Specimen: BLOOD  Result Value Ref Range Status   Specimen Description BLOOD RIGHT ANTECUBITAL  Final   Special Requests   Final    BOTTLES DRAWN AEROBIC AND ANAEROBIC RIGHT ANTECUBITAL   Culture   Final    NO GROWTH 3 DAYS Performed at [facility], [address] [place], [place] [address]    Report Status PENDING  Incomplete  Wound or Superficial Culture     Status: None (Preliminary result)   Collection Time: 01/15/20 10:24 AM   Specimen: Buttocks; Wound  Result Value Ref Range Status   Specimen Description   Final    BUTTOCKS Performed at [facility], [address], [place], [place] [address]    Special Requests   Final    Normal Performed at [facility], [address] [place], [place] [address]    Gram Stain   Final    RARE WBC PRESENT, PREDOMINANTLY PMN FEW GRAM POSITIVE COCCI IN CLUSTERS Performed at [facility], [address] [place], [place] [address]    Culture FEW METHICILLIN RESISTANT STAPHYLOCOCCUS AUREUS  Final   Report Status PENDING  Incomplete   Organism ID, Bacteria METHICILLIN RESISTANT STAPHYLOCOCCUS AUREUS  Final      Susceptibility   Methicillin resistant staphylococcus aureus - MIC*    CIPROFLOXACIN >=8 RESISTANT Resistant     ERYTHROMYCIN >=8 RESISTANT Resistant     GENTAMICIN <=0.5 SENSITIVE Sensitive     OXACILLIN >=4 RESISTANT Resistant     TETRACYCLINE <=1 SENSITIVE Sensitive      VANCOMYCIN <=0.5 SENSITIVE Sensitive     TRIMETH/SULFA 80 RESISTANT Resistant     CLINDAMYCIN <=0.25 SENSITIVE Sensitive     RIFAMPIN <=0.5 SENSITIVE Sensitive     Inducible Clindamycin NEGATIVE Sensitive     * FEW METHICILLIN RESISTANT STAPHYLOCOCCUS AUREUS  SARS CORONAVIRUS 2 (TAT 6-24 HRS) Nasopharyngeal Nasopharyngeal Swab     Status: None   Collection Time: 01/15/20 10:37 AM   Specimen: Nasopharyngeal Swab  Result Value Ref Range Status   SARS Coronavirus 2 NEGATIVE NEGATIVE Final    Comment: (NOTE) SARS-CoV-2 target nucleic acids are NOT DETECTED. The SARS-CoV-2 RNA is generally detectable in upper and lower respiratory specimens during the acute phase of infection. Negative results do not preclude SARS-CoV-2 infection, do not rule out co-infections with other pathogens, and should not be used as the sole basis for treatment or other patient management decisions. Negative results must be combined with clinical observations, patient history, and epidemiological information. The expected result is Negative. Fact Sheet for Patients: HairSlick.no Fact Sheet for Healthcare Providers: quierodirigir.com This test is not yet approved or cleared by the [place] FDA and  has been authorized for detection and/or diagnosis of SARS-CoV-2 by FDA under an Emergency Use Authorization (EUA). This EUA will remain  in effect (meaning this test can be used) for the duration of the COVID-19 declaration under Section 56 4(b)(1) of the Act, 21 U.S.C. section 360bbb-3(b)(1), unless the authorization is  terminated or revoked sooner. Performed at [facility], [address] [place], [place] [address]     Today   Subjective    Gracelin Weisberg is very eager to go home.  She almost signed out AMA last night but was willing to stay until wound culture sensitivities returned.  Objective   Blood pressure (!) 117/59, pulse 80,  temperature 98.3 F (36.8 C), temperature source Oral, resp. rate 16, height 5\' 6"  (1.676 m), weight 56.7 kg, last menstrual period 12/28/2019, SpO2 100 %.  No intake or output data in the 24 hours ending 01/18/20 1413  Exam Awake Alert, Oriented x 3, Normal affect .AT,PERRAL Symmetrical Chest wall movement, Good air movement bilaterally, CTAB RRR +ve B.Sounds, Abd Soft, Non tender,  Right buttock with CDI No edema   Data Review   CBC w Diff:  Lab Results  Component Value Date   WBC 8.3 01/17/2020   HGB 10.1 (L) 01/17/2020   HCT 31.8 (L) 01/17/2020   PLT 275 01/17/2020   LYMPHOPCT 25 01/17/2020   MONOPCT 7 01/17/2020   EOSPCT 1 01/17/2020   BASOPCT 1 01/17/2020    CMP:  Lab Results  Component Value Date   NA 138 01/17/2020   K 3.9 01/17/2020   CL 105 01/17/2020   CO2 23 01/17/2020   BUN 9 01/17/2020   CREATININE 0.62 01/17/2020   PROT 8.2 (H) 01/15/2020   ALBUMIN 4.4 01/15/2020   BILITOT 1.2 01/15/2020   ALKPHOS 80 01/15/2020   AST 18 01/15/2020   ALT 14 01/15/2020  .   Total Time in preparing paper work, data evaluation and todays exam - 35 minutes  03/16/2020 M.D on 01/18/2020 at 2:13 PM  [facility]   Office  [phone]

## 2020-01-19 LAB — AEROBIC CULTURE W GRAM STAIN (SUPERFICIAL SPECIMEN): Special Requests: NORMAL

## 2020-01-20 LAB — CULTURE, BLOOD (ROUTINE X 2)
Culture: NO GROWTH
Culture: NO GROWTH
Special Requests: ADEQUATE

## 2020-05-12 DIAGNOSIS — Z419 Encounter for procedure for purposes other than remedying health state, unspecified: Secondary | ICD-10-CM | POA: Diagnosis not present

## 2020-06-08 DIAGNOSIS — Z79899 Other long term (current) drug therapy: Secondary | ICD-10-CM | POA: Diagnosis not present

## 2020-06-09 DIAGNOSIS — Z79899 Other long term (current) drug therapy: Secondary | ICD-10-CM | POA: Diagnosis not present

## 2020-06-12 DIAGNOSIS — Z419 Encounter for procedure for purposes other than remedying health state, unspecified: Secondary | ICD-10-CM | POA: Diagnosis not present

## 2020-07-13 DIAGNOSIS — Z419 Encounter for procedure for purposes other than remedying health state, unspecified: Secondary | ICD-10-CM | POA: Diagnosis not present

## 2020-08-12 DIAGNOSIS — Z419 Encounter for procedure for purposes other than remedying health state, unspecified: Secondary | ICD-10-CM | POA: Diagnosis not present

## 2020-09-12 DIAGNOSIS — Z419 Encounter for procedure for purposes other than remedying health state, unspecified: Secondary | ICD-10-CM | POA: Diagnosis not present

## 2020-10-12 DIAGNOSIS — Z419 Encounter for procedure for purposes other than remedying health state, unspecified: Secondary | ICD-10-CM | POA: Diagnosis not present

## 2020-11-12 DIAGNOSIS — Z419 Encounter for procedure for purposes other than remedying health state, unspecified: Secondary | ICD-10-CM | POA: Diagnosis not present

## 2020-12-13 DIAGNOSIS — Z419 Encounter for procedure for purposes other than remedying health state, unspecified: Secondary | ICD-10-CM | POA: Diagnosis not present

## 2021-01-10 DIAGNOSIS — Z419 Encounter for procedure for purposes other than remedying health state, unspecified: Secondary | ICD-10-CM | POA: Diagnosis not present

## 2021-02-10 DIAGNOSIS — Z419 Encounter for procedure for purposes other than remedying health state, unspecified: Secondary | ICD-10-CM | POA: Diagnosis not present

## 2021-02-11 ENCOUNTER — Other Ambulatory Visit: Payer: Self-pay

## 2021-02-11 ENCOUNTER — Emergency Department
Admission: EM | Admit: 2021-02-11 | Discharge: 2021-02-11 | Disposition: A | Discharge status: Home / Self Care | Payer: Medicaid Other | Attending: Emergency Medicine | Admitting: Emergency Medicine

## 2021-02-11 ENCOUNTER — Encounter: Payer: Self-pay | Admitting: Emergency Medicine

## 2021-02-11 DIAGNOSIS — I10 Essential (primary) hypertension: Secondary | ICD-10-CM | POA: Insufficient documentation

## 2021-02-11 DIAGNOSIS — F1910 Other psychoactive substance abuse, uncomplicated: Secondary | ICD-10-CM | POA: Insufficient documentation

## 2021-02-11 DIAGNOSIS — F419 Anxiety disorder, unspecified: Secondary | ICD-10-CM | POA: Insufficient documentation

## 2021-02-11 DIAGNOSIS — F329 Major depressive disorder, single episode, unspecified: Secondary | ICD-10-CM | POA: Diagnosis not present

## 2021-02-11 DIAGNOSIS — R45851 Suicidal ideations: Secondary | ICD-10-CM | POA: Insufficient documentation

## 2021-02-11 DIAGNOSIS — Z20822 Contact with and (suspected) exposure to covid-19: Secondary | ICD-10-CM | POA: Insufficient documentation

## 2021-02-11 LAB — COMPREHENSIVE METABOLIC PANEL
ALT: 17 U/L (ref 0–44)
AST: 20 U/L (ref 15–41)
Albumin: 4.9 g/dL (ref 3.5–5.0)
Alkaline Phosphatase: 66 U/L (ref 38–126)
Anion gap: 14 (ref 5–15)
BUN: 25 mg/dL — ABNORMAL HIGH (ref 6–20)
CO2: 21 mmol/L — ABNORMAL LOW (ref 22–32)
Calcium: 9.3 mg/dL (ref 8.9–10.3)
Chloride: 100 mmol/L (ref 98–111)
Creatinine, Ser: 0.89 mg/dL (ref 0.44–1.00)
GFR, Estimated: >60 mL/min (ref >60)
Glucose, Bld: 91 mg/dL (ref 70–99)
Potassium: 3.6 mmol/L (ref 3.5–5.1)
Sodium: 135 mmol/L (ref 135–145)
Total Bilirubin: 2.2 mg/dL — ABNORMAL HIGH (ref 0.3–1.2)
Total Protein: 8.6 g/dL — ABNORMAL HIGH (ref 6.5–8.1)

## 2021-02-11 LAB — URINE DRUG SCREEN, QUALITATIVE (ARMC ONLY)
Amphetamines, Ur Screen: POSITIVE — AB
Barbiturates, Ur Screen: NOT DETECTED
Benzodiazepine, Ur Scrn: NOT DETECTED
Cannabinoid 50 Ng, Ur ~~LOC~~: POSITIVE — AB
Cocaine Metabolite,Ur ~~LOC~~: NOT DETECTED
MDMA (Ecstasy)Ur Screen: NOT DETECTED
Methadone Scn, Ur: NOT DETECTED
Opiate, Ur Screen: NOT DETECTED
Phencyclidine (PCP) Ur S: NOT DETECTED
Tricyclic, Ur Screen: NOT DETECTED

## 2021-02-11 LAB — CBC
HCT: 35.6 % — ABNORMAL LOW (ref 36.0–46.0)
Hemoglobin: 11.1 g/dL — ABNORMAL LOW (ref 12.0–15.0)
MCH: 26.1 pg (ref 26.0–34.0)
MCHC: 31.2 g/dL (ref 30.0–36.0)
MCV: 83.6 fL (ref 80.0–100.0)
Platelets: 363 10*3/uL (ref 150–400)
RBC: 4.26 MIL/uL (ref 3.87–5.11)
RDW: 19.2 % — ABNORMAL HIGH (ref 11.5–15.5)
WBC: 9.6 10*3/uL (ref 4.0–10.5)
nRBC: 0.3 % — ABNORMAL HIGH (ref 0.0–0.2)

## 2021-02-11 LAB — RESP PANEL BY RT-PCR (FLU A&B, COVID) ARPGX2
Influenza A by PCR: NEGATIVE
Influenza B by PCR: NEGATIVE
SARS Coronavirus 2 by RT PCR: NEGATIVE

## 2021-02-11 LAB — PREGNANCY, URINE: Preg Test, Ur: NEGATIVE

## 2021-02-11 LAB — ACETAMINOPHEN LEVEL: Acetaminophen (Tylenol), Serum: <10 ug/mL — ABNORMAL LOW (ref 10–30)

## 2021-02-11 LAB — RAPID HIV SCREEN (HIV 1/2 AB+AG)
HIV 1/2 Antibodies: NONREACTIVE
HIV-1 P24 Antigen - HIV24: NONREACTIVE

## 2021-02-11 LAB — SALICYLATE LEVEL: Salicylate Lvl: <7 mg/dL — ABNORMAL LOW (ref 7.0–30.0)

## 2021-02-11 LAB — ETHANOL: Alcohol, Ethyl (B): <10 mg/dL (ref <10)

## 2021-02-11 NOTE — ED Notes (Signed)
Unable to obtain blood samples at this time, lab notified
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
## 2021-02-11 NOTE — ED Notes (Signed)
Pt calm and cooperative. Pt's friend brought her here because she hasn't eaten or slept in 3 days. Pt states using fentanyl, cocaine, and meth with last use this morning. Pt states hearing voices due to the drugs that are singing to her-not saying anything threatening or dangerous. Pt sometimes laughs to herself.

## 2021-02-11 NOTE — ED Notes (Signed)
Called [facility] for consult [id number]

## 2021-02-11 NOTE — ED Notes (Signed)
Pt in interview room with TTS.

## 2021-02-11 NOTE — ED Notes (Signed)
Pt given meal tray.

## 2021-02-11 NOTE — ED Notes (Signed)
Lab at bedside to stick for labs

## 2021-02-11 NOTE — ED Triage Notes (Signed)
Pt friend, Darl Householder [phone] Please call if needed

## 2021-02-11 NOTE — ED Triage Notes (Signed)
Pt to triage states she does not remember the last 3 days.  Pt also admits to IV drug use.  Pt vomiting clear fluid in triage.  Pt admits recent SI, denies current SI.  Pt restless in triage.

## 2021-02-11 NOTE — Discharge Instructions (Signed)

## 2021-02-11 NOTE — ED Triage Notes (Signed)
Pt friend who brought her in reports last pm the patient came to his house and advised him she has not slept or ate in 3 days. Friends reports she kept looking out the window and was hearing and seeing things. Pt friend reports pt stated to him sh did not want to live anymore and was all over the place and has no memory of things that happened recently.

## 2021-02-11 NOTE — ED Notes (Signed)
Pt was wanting to sign out AMA. Discussed with Dr. Fanny Bien. Pt continues to deny SI/HI. Dr. Fanny Bien to go discuss this with patient.

## 2021-02-11 NOTE — ED Notes (Signed)
Pt verbalized understanding of d/c instructions at this time. Pt denies further questions. Pt signed E-signature pad at this time.   Pt continues to deny SI/HI at this time.  Pt ambulatory to ED entrance, NAD noted, steady gait noted, RR even and unlabored at this time.

## 2021-02-11 NOTE — ED Notes (Addendum)
Pt changed into hospital scrubs, all belongings placed in belongings bags as follows:  Estanislado Emms Black socks Black slider shoes Keys on lanyard Choker necklace bracelet Hair tie Hat Bra Underwear Wig

## 2021-02-11 NOTE — ED Provider Notes (Signed)
[facility] Emergency Department Provider Note   ____________________________________________   Event Date/Time   First MD Initiated Contact with Patient 02/11/21 1148     (approximate)  I have reviewed the triage vital signs and the nursing notes.   HISTORY  Chief Complaint Psychiatric Evaluation, Addiction Problem (Use of "Meth", Phentenal, marijuana, mushrooms), Anxiety (Reports social anxiety concerns.She expressed social anxiety of being in the hospital. "I feel awkward, I talk a lot and I feel insecure, I always have". ), and Depression (Reports history of depression)    HPI Briana Ortega is a 25 y.o. female   reports she came in because she is not clear on her much of her memory over the last 2 to 3 days  She reports she is pretty sure this is happened because of her ongoing drug use.  She reports she is methamphetamine yesterday does remember some of the evening, and then also use fentanyl.  She knows that her boyfriend had to give her naloxone twice at home last night.  She then used fentanyl again this morning.  She also uses mushrooms and has been doing so over the last few days as well.  She reports she has been a heavy drug user and attic for about 2 years.  She tells me adamantly she does not want hurt herself she is not suicidal.  She does report though just like any drug user as she describes it, that she does have times where she feels very sad depressed and almost suicidal but that is not how she is feeling now.  She does have something to live for she has a 23-year-old daughter whom her ex-boyfriend cares for.  Denies any desire to hurt her self or anyone else at this point.  Patient reports she started to feel better recollection of yesterday is cloudy and she thinks it is probably due to drug use  Denies any recent illness no fevers or chills.  She does use IV drugs from time to time, she reports she would like to get tested for hepatitis C  and HIV while here as well  Past Medical History:  Diagnosis Date  . Chlamydia   . GC (gonococcus infection)     Patient Active Problem List   Diagnosis Date Noted  . Abscess of buttock, right 01/15/2020  . Hypokalemia 01/15/2020  . Essential hypertension 01/15/2020  . Postpartum care following cesarean delivery 06/01/2016  . Normal labor and delivery 05/29/2016  . Abdominal pain affecting pregnancy 05/08/2016  . Indication for care in labor and delivery, antepartum 05/08/2016  . Chlamydia trachomatis infection 07/03/2013  . Gonorrhea in female 07/03/2013  . Vaginal discharge 07/01/2013  . BV (bacterial vaginosis) 07/01/2013  . Sexually active at young age 54/20/2014  . Neck pain 02/24/2013  . Well child check 01/11/2012    Past Surgical History:  Procedure Laterality Date  . CESAREAN SECTION N/A 05/30/2016   Procedure: CESAREAN SECTION;  Surgeon: Nadara Mustardobert P Harris, MD;  Location: [facility] ORS;  Service: Obstetrics;  Laterality: N/A;  . TOOTH EXTRACTION      Prior to Admission medications   Medication Sig Start Date End Date Taking? Authorizing Provider  Norethindrone Acetate-Ethinyl Estrad-FE (MICROGESTIN 24 FE) 1-20 MG-MCG(24) tablet Take 1 tablet by mouth daily. Patient not taking: Reported on 01/14/2020 07/13/19   Copland, Ilona SorrelAlicia B, PA-C    Allergies Patient has no known allergies.  Family History  Problem Relation Age of Onset  . Cancer Maternal Grandmother  COLON  . Cancer Maternal Grandfather        COLON  . Cancer Other        COLON    Social History Social History   Tobacco Use  . Smoking status: Never Smoker  . Smokeless tobacco: Never Used  Vaping Use  . Vaping Use: Never used  Substance Use Topics  . Alcohol use: No  . Drug use: No    Review of Systems Constitutional: No fever/chills Cardiovascular: Denies chest pain. Respiratory: Denies shortness of breath. Gastrointestinal: No abdominal pain.   Genitourinary: Negative for dysuria.   Denies pregnancy. Musculoskeletal: Negative for back pain. Skin: Negative for rash. Neurological: Negative for headaches, areas of focal weakness or numbness.    ____________________________________________   PHYSICAL EXAM:  VITAL SIGNS: ED Triage Vitals  Enc Vitals Group     BP 02/11/21 1056 118/79     Pulse Rate 02/11/21 1056 87     Resp 02/11/21 1056 18     Temp 02/11/21 1056 97.9 F (36.6 C)     Temp src --      SpO2 02/11/21 1056 100 %     Weight 02/11/21 1058 122 lb (55.3 kg)     Height 02/11/21 1058 5' 6.5" (1.689 m)     Head Circumference --      Peak Flow --      Pain Score 02/11/21 1058 0     Pain Loc --      Pain Edu? --      Excl. in GC? --     Constitutional: Alert and oriented. Well appearing and in no acute distress though just a little bit elevated in mood. Eyes: Conjunctivae are normal. Head: Atraumatic. Nose: No congestion/rhinnorhea. Mouth/Throat: Mucous membranes are moist. Neck: No stridor.  Cardiovascular: Normal rate, regular rhythm. Grossly normal heart sounds.  Good peripheral circulation. Respiratory: Normal respiratory effort.  No retractions. Lungs CTAB. Gastrointestinal: Soft and nontender. No distention. Musculoskeletal: No lower extremity tenderness nor edema. Neurologic:  Normal speech and language. No gross focal neurologic deficits are appreciated.  Walks with normal gait. Skin:  Skin is warm, dry and intact. No rash noted. Psychiatric: Mood and affect are slightly elevated.  She is alert though very well oriented.  She reports that she does not want to harm her self and is not suicidal, but at times in the past she has had thoughts of suicide generally while recovering from drug use.  Denies hallucinations.  Reports a bit of a hazy memory last couple days due to what she describes as heavy drug use  ____________________________________________   LABS (all labs ordered are listed, but only abnormal results are displayed)  Labs  Reviewed  COMPREHENSIVE METABOLIC PANEL - Abnormal; Notable for the following components:      Result Value   CO2 21 (*)    BUN 25 (*)    Total Protein 8.6 (*)    Total Bilirubin 2.2 (*)    All other components within normal limits  SALICYLATE LEVEL - Abnormal; Notable for the following components:   Salicylate Lvl <7.0 (*)    All other components within normal limits  ACETAMINOPHEN LEVEL - Abnormal; Notable for the following components:   Acetaminophen (Tylenol), Serum <10 (*)    All other components within normal limits  CBC - Abnormal; Notable for the following components:   Hemoglobin 11.1 (*)    HCT 35.6 (*)    RDW 19.2 (*)    nRBC 0.3 (*)  All other components within normal limits  URINE DRUG SCREEN, QUALITATIVE ([facility] ONLY) - Abnormal; Notable for the following components:   Amphetamines, Ur Screen POSITIVE (*)    Cannabinoid 50 Ng, Ur [place] POSITIVE (*)    All other components within normal limits  RESP PANEL BY RT-PCR (FLU A&B, COVID) ARPGX2  ETHANOL  PREGNANCY, URINE  HEPATITIS C ANTIBODY  RAPID HIV SCREEN (HIV 1/2 AB+AG)   ____________________________________________  EKG   ____________________________________________  RADIOLOGY  No noted indication.  Patient is alert well oriented, does not show evidence of acute deficits and has no headache.  She does have poor recollection memory for over the last evening but she also reports use of multiple polysubstances and also having to be reversed with naloxone at home. ____________________________________________   PROCEDURES  Procedure(s) performed: None  Procedures  Critical Care performed: No  ____________________________________________   INITIAL IMPRESSION / ASSESSMENT AND PLAN / ED COURSE  Pertinent labs & imaging results that were available during my care of the patient were reviewed by me and considered in my medical decision making (see chart for details).   Patient presents, now alert well  oriented.  Poor recollection of the last couple of days which she reported is due to heavy drug use.  No evidence of trauma.  She is neurologically intact no headache.  Reassuring exam at this point, but I do suspect that heavy polysubstance abuse is a major problem for her.  She reports having to be reversed from an overdose last night as well and preferred agents are fentanyl and methamphetamine but also using hallucinogenic's at times  Clinical Course as of 02/13/21 1012  Sat Feb 11, 2021  1159 Patient currently utilizing the restroom.  Will evaluate upon her return [MQ]  1544 Patient alert, sitting in the hallway.  She spoke with Deanna Artis our TTS counselor.  I also spoke with Deanna Artis, the patient denies active suicidal ideation.  I do not find reason that the patient needs to be under involuntary commitment at this point.  I certainly do and recommended she stay for psychiatric consultation, she is debating that but also reports that she might just want to leave and follow-up with her counselor at [facility] where she goes.  [MQ]    Clinical Course User Index [MQ] Sharyn Creamer, MD   ----------------------------------------- 1:12 PM on 02/11/2021 -----------------------------------------  Moved placed psychiatry consult patient is voluntary.  She tells me she has thoughts of suicidal ideations in the past along with depressive symptoms primarily during times when she is not acutely using drugs.  She denies adamantly that she wants to hurt her self or anyone else at this point.  She denies suicidality.  Patient's medical work-up unrevealing.  Very reassuring.  HIV and HCV antibody negative.  Voiced that she no longer wishes to stay further for evaluation of substance abuse and psychiatry consult.  She is not acutely suicidal and does not appear to be at acute risk of harm to herself.  She reports her drug use to be purely recreational.  Does not appear to be actively responding to any internal stimuli  and denies hallucinations now.  Appears that she is much improved, does not meet criteria for involuntary commitment.  ____________________________________________   FINAL CLINICAL IMPRESSION(S) / ED DIAGNOSES  Final diagnoses:  Polysubstance abuse (HCC)        Note:  This document was prepared using Dragon voice recognition software and may include unintentional dictation errors       Jacobe Study,  Loraine Leriche, MD 02/13/21 1015

## 2021-02-12 LAB — HEPATITIS C ANTIBODY: HCV Ab: NONREACTIVE

## 2021-02-13 ENCOUNTER — Telehealth: Payer: Self-pay

## 2021-02-13 NOTE — Telephone Encounter (Signed)
Transition Care Management Follow-up Telephone Call  Date of discharge and from where: 02/11/2021 from [facility]  How have you been since you were released from the hospital? Pt stated that she is feeling well today and had no questions or concerns.   Any questions or concerns? No  Items Reviewed:  Did the pt receive and understand the discharge instructions provided? Yes   Medications obtained and verified? Yes   Other? No   Any new allergies since your discharge? No   Dietary orders reviewed? n/a  Do you have support at home? Yes   Functional Questionnaire: (I = Independent and D = Dependent) ADLs: I  Bathing/Dressing- I  Meal Prep- I  Eating- I  Maintaining continence- I  Transferring/Ambulation- I  Managing Meds- I  Follow up appointments reviewed:   PCP Hospital f/u appt confirmed? No    Specialist Hospital f/u appt confirmed? No    Are transportation arrangements needed? No   If their condition worsens, is the pt aware to call PCP or go to the Emergency Dept.? Yes  Was the patient provided with contact information for the PCP's office or ED? Yes  Was to pt encouraged to call back with questions or concerns? Yes

## 2021-03-12 DIAGNOSIS — Z419 Encounter for procedure for purposes other than remedying health state, unspecified: Secondary | ICD-10-CM | POA: Diagnosis not present

## 2021-04-12 DIAGNOSIS — Z419 Encounter for procedure for purposes other than remedying health state, unspecified: Secondary | ICD-10-CM | POA: Diagnosis not present

## 2021-05-12 DIAGNOSIS — Z419 Encounter for procedure for purposes other than remedying health state, unspecified: Secondary | ICD-10-CM | POA: Diagnosis not present

## 2021-06-12 DIAGNOSIS — Z419 Encounter for procedure for purposes other than remedying health state, unspecified: Secondary | ICD-10-CM | POA: Diagnosis not present

## 2021-07-03 IMAGING — CT CT PELVIS W/ CM
2 of 3 series · 17 of 46 positions shown, 19 images · IV contrast (APPLIED)
Comparison: None.

CLINICAL DATA: Right buttock abscess.

EXAM:
CT PELVIS WITH CONTRAST
TECHNIQUE: Multidetector CT imaging of the pelvis was performed using the
standard protocol following the bolus administration of intravenous
contrast.
CONTRAST:  100mL OMNIPAQUE IOHEXOL 300 MG/ML  SOLN

[Series 2: axial st · axial · 0.79mm/px · z∈[-1069,-834]mm · 14 of 55 slices shown, 16 images]
[im 4/55  soft-tissue]
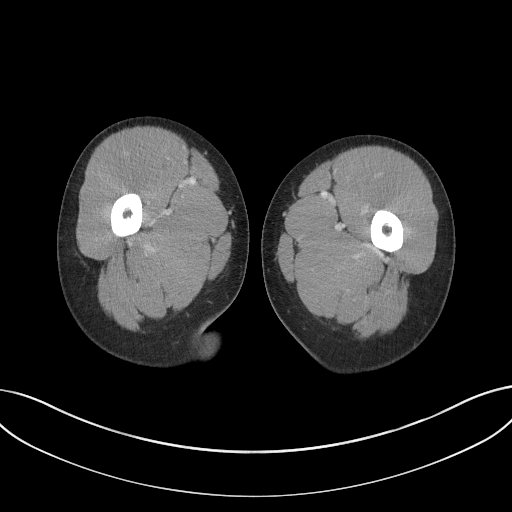
[im 4/55  bone]
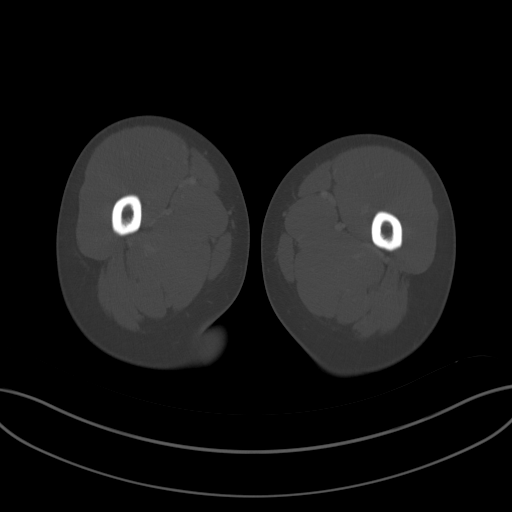
[im 7/55  soft-tissue]
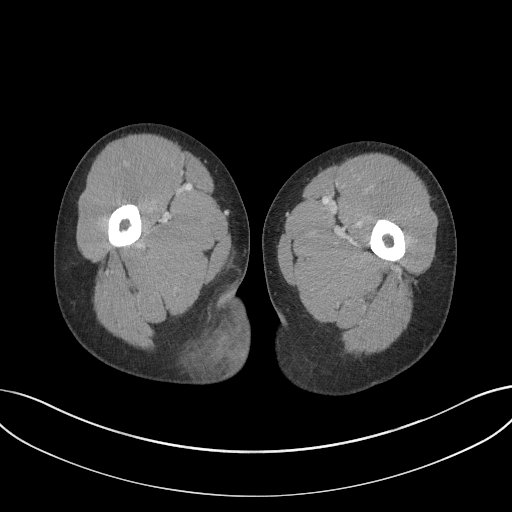
[im 11/55  soft-tissue]
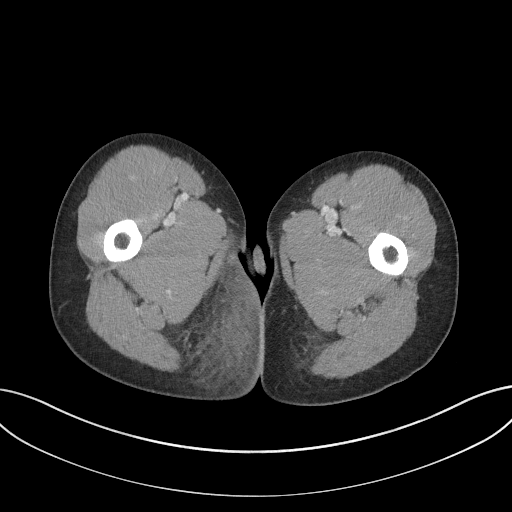
[im 14/55  soft-tissue]
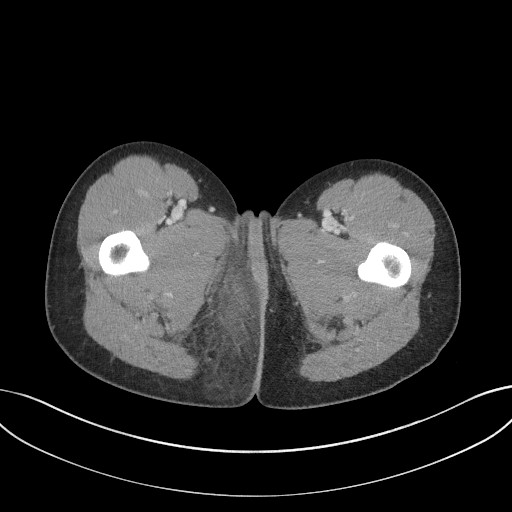
[im 18/55  soft-tissue]
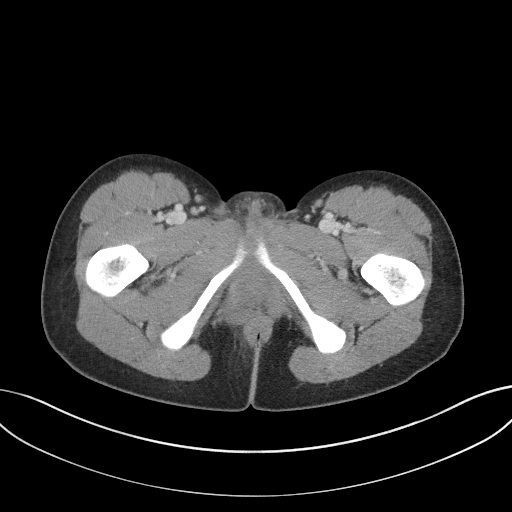
[im 21/55  soft-tissue]
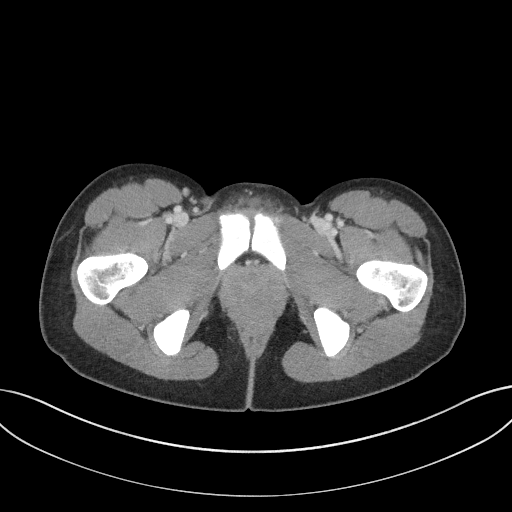
[im 25/55  soft-tissue]
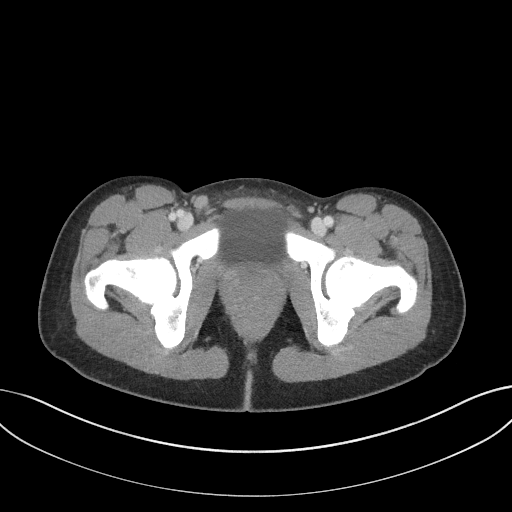
[im 30/55  soft-tissue]
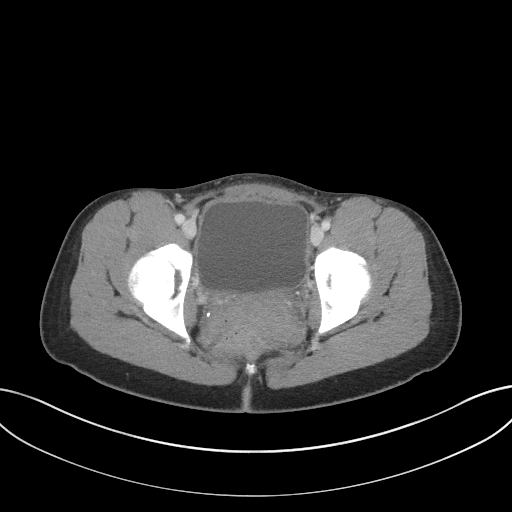
[im 34/55  soft-tissue]
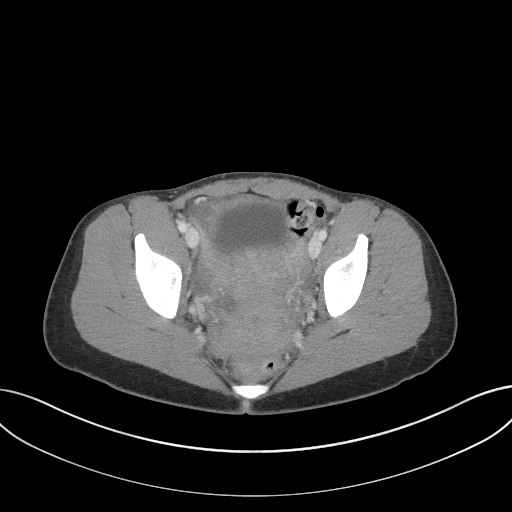
[im 34/55  bone]
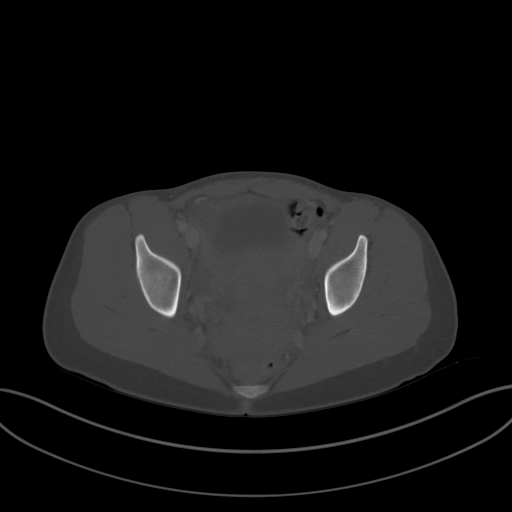
[im 37/55  soft-tissue]
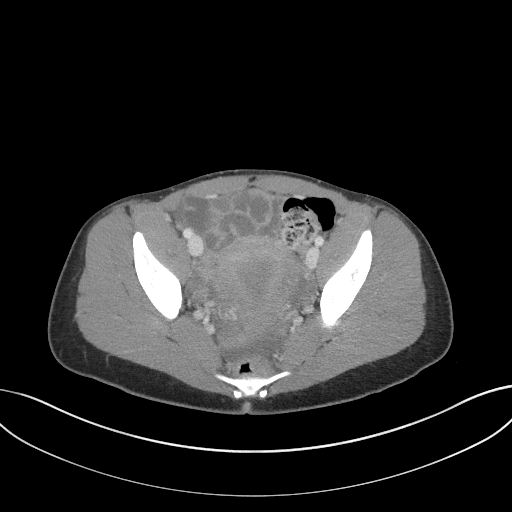
[im 41/55  soft-tissue]
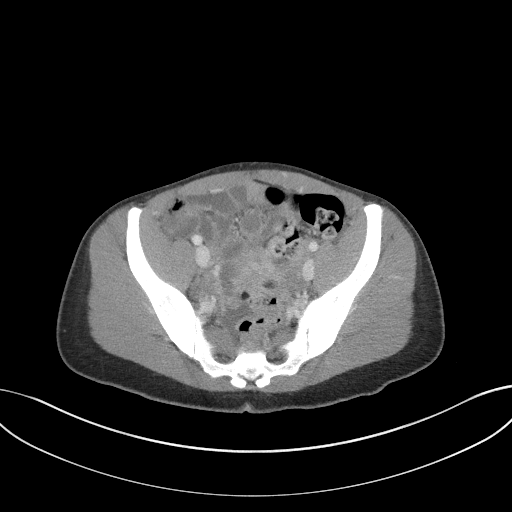
[im 44/55  soft-tissue]
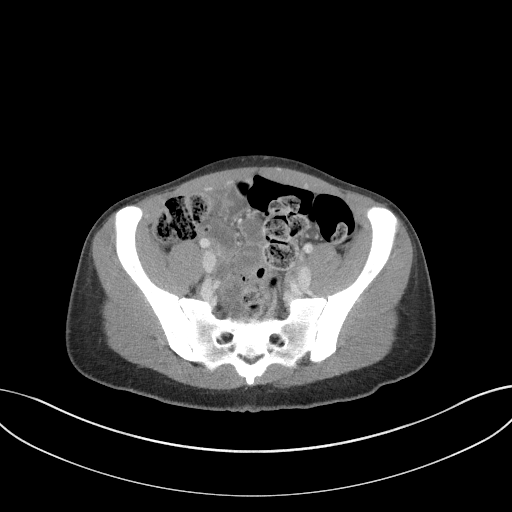
[im 48/55  soft-tissue]
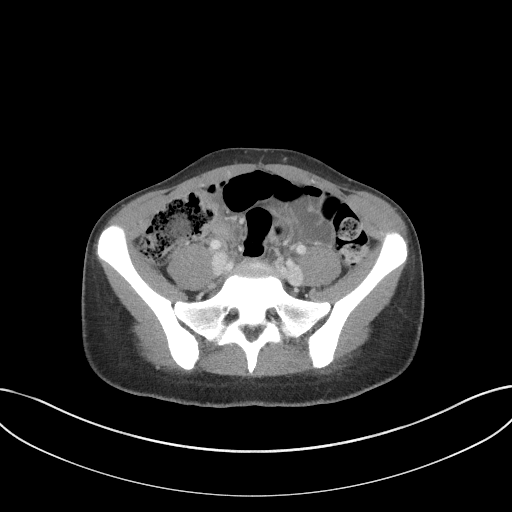
[im 51/55  soft-tissue]
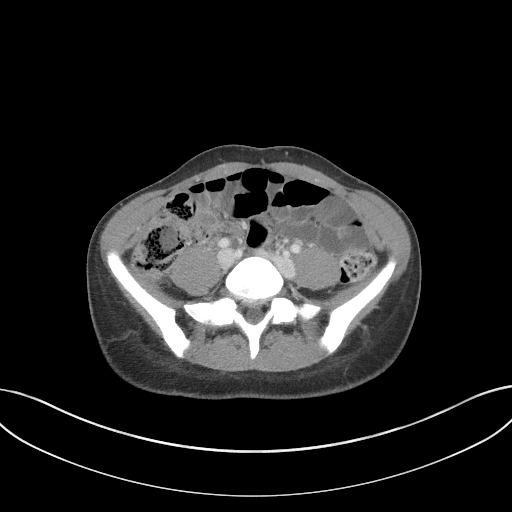

[Series 4: coronal st · coronal · 0.56mm/px · 3 of 67 slices shown]
[im 23/67  soft-tissue]
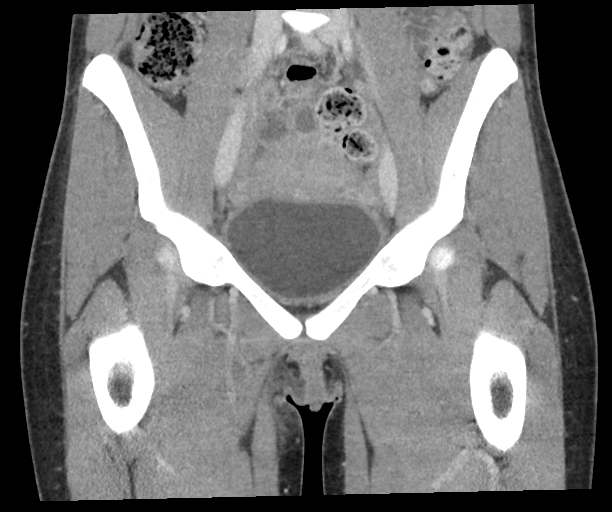
[im 30/67  soft-tissue]
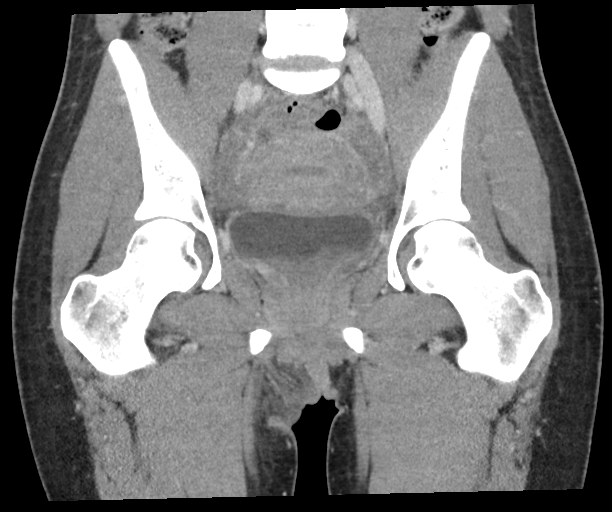
[im 37/67  soft-tissue]
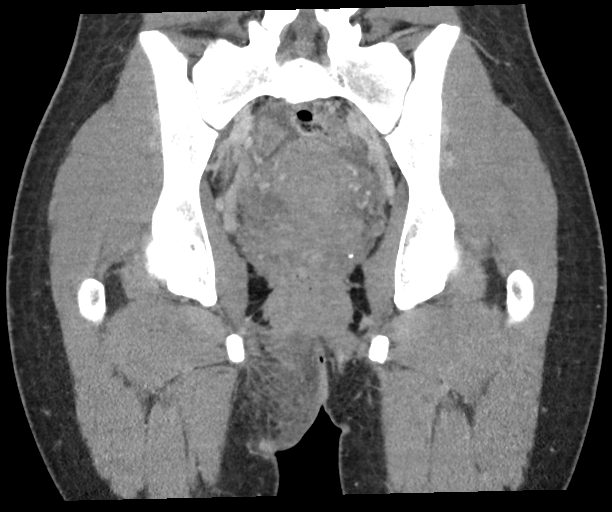

[17 of 46 positions shown; findings below may reference images not displayed]

FINDINGS: There is a diffuse edematous pattern within the knee medial right
buttock affecting primarily the subcutaneous fatty tissues. At the
superior edge, there is a 2 cm area that appear slightly more
discrete and low density that could actually be drainable fluid.
This may be Peri rectal or adjacent to the right anal verge.

No deep pelvic extension. The patient does have a small amount of
free fluid in the pelvis, but this is often seen in young females.
The uterus is self appears normal. No adnexal mass. No other bowel
finding the pelvis. No bone abnormality.
IMPRESSION: Cellulitis type pattern extensively throughout the right medial
inferior buttock. Slightly more discrete 2 cm low-density area which
is either perirectal on the right or adjacent to the right anal
verge, which could have been the source of the infection. This could
be a small drainable collection, but the majority of the
inflammatory change looks more like cellulitis.

## 2021-07-13 DIAGNOSIS — Z419 Encounter for procedure for purposes other than remedying health state, unspecified: Secondary | ICD-10-CM | POA: Diagnosis not present

## 2021-08-12 DIAGNOSIS — Z419 Encounter for procedure for purposes other than remedying health state, unspecified: Secondary | ICD-10-CM | POA: Diagnosis not present

## 2021-08-29 DIAGNOSIS — N898 Other specified noninflammatory disorders of vagina: Secondary | ICD-10-CM | POA: Diagnosis not present

## 2021-08-29 DIAGNOSIS — B9689 Other specified bacterial agents as the cause of diseases classified elsewhere: Secondary | ICD-10-CM | POA: Diagnosis not present

## 2021-08-29 DIAGNOSIS — N760 Acute vaginitis: Secondary | ICD-10-CM | POA: Diagnosis not present

## 2021-09-09 DIAGNOSIS — H5213 Myopia, bilateral: Secondary | ICD-10-CM | POA: Diagnosis not present

## 2021-09-12 DIAGNOSIS — Z419 Encounter for procedure for purposes other than remedying health state, unspecified: Secondary | ICD-10-CM | POA: Diagnosis not present

## 2021-10-12 DIAGNOSIS — Z419 Encounter for procedure for purposes other than remedying health state, unspecified: Secondary | ICD-10-CM | POA: Diagnosis not present

## 2021-11-12 DIAGNOSIS — Z419 Encounter for procedure for purposes other than remedying health state, unspecified: Secondary | ICD-10-CM | POA: Diagnosis not present

## 2021-12-08 ENCOUNTER — Other Ambulatory Visit: Payer: Self-pay

## 2021-12-08 ENCOUNTER — Emergency Department
Admission: EM | Admit: 2021-12-08 | Discharge: 2021-12-08 | Disposition: A | Discharge status: Home / Self Care | Payer: Medicaid Other | Attending: Emergency Medicine | Admitting: Emergency Medicine

## 2021-12-08 DIAGNOSIS — Z3491 Encounter for supervision of normal pregnancy, unspecified, first trimester: Secondary | ICD-10-CM

## 2021-12-08 DIAGNOSIS — Z20822 Contact with and (suspected) exposure to covid-19: Secondary | ICD-10-CM | POA: Insufficient documentation

## 2021-12-08 DIAGNOSIS — O26891 Other specified pregnancy related conditions, first trimester: Secondary | ICD-10-CM | POA: Insufficient documentation

## 2021-12-08 DIAGNOSIS — Z3A00 Weeks of gestation of pregnancy not specified: Secondary | ICD-10-CM | POA: Diagnosis not present

## 2021-12-08 DIAGNOSIS — Z3A01 Less than 8 weeks gestation of pregnancy: Secondary | ICD-10-CM | POA: Diagnosis not present

## 2021-12-08 DIAGNOSIS — Z3201 Encounter for pregnancy test, result positive: Secondary | ICD-10-CM | POA: Diagnosis not present

## 2021-12-08 LAB — POC URINE PREG, ED: Preg Test, Ur: POSITIVE — AB

## 2021-12-08 LAB — RESP PANEL BY RT-PCR (FLU A&B, COVID) ARPGX2
Influenza A by PCR: NEGATIVE
Influenza B by PCR: NEGATIVE
SARS Coronavirus 2 by RT PCR: NEGATIVE

## 2021-12-08 MED ORDER — PRENATAL 27-0.8 MG PO TABS: 1.0000 | ORAL_TABLET | Freq: Every day | ORAL | 0 refills | Status: AC

## 2021-12-08 NOTE — ED Provider Notes (Signed)
[place] EMERGENCY DEPARTMENT Provider Note   CSN: [id number] Arrival date & time: 12/08/21  2006     History  Chief Complaint  Patient presents with   wants a covid and pregnancy test    Briana Ortega is a 26 y.o. female with no past medical history presents to the emergency department for evaluation of pregnancy test and COVID test.  Patient states she is felt pregnant over the last couple of weeks, states her breast have been sore and she missed her period.  She is also stating she came into contact with someone with COVID at work and is requesting a COVID test so that she can return to work.  She denies any abdominal pain nausea vomiting or diarrhea.  She has had 1 previous pregnancy that was successful, 5 years ago.  She had a C-section.  She denies any pregnancy complications.  She denies any abdominal pain, vaginal bleeding or discharge.  She is feeling well with no chest pain or shortness of breath  Last menstrual period October 20, 2021  HPI     Home Medications Prior to Admission medications   Medication Sig Start Date End Date Taking? Authorizing Provider  Prenatal Vit-Fe Fumarate-FA (MULTIVITAMIN-PRENATAL) 27-0.8 MG TABS tablet Take 1 tablet by mouth daily at 12 noon. 12/08/21  Yes Duanne Guess, PA-C  Norethindrone Acetate-Ethinyl Estrad-FE (MICROGESTIN 24 FE) 1-20 MG-MCG(24) tablet Take 1 tablet by mouth daily. Patient not taking: Reported on 01/14/2020 [id number]   Copland, Deirdre Evener, PA-C      Allergies    Patient has no known allergies.    Review of Systems   Review of Systems  Physical Exam Updated Vital Signs BP (!) 141/102    Pulse (!) 101    Temp 99 F (37.2 C) (Oral)    Resp 20    Ht 5\' 6"  (1.676 m)    Wt 50.8 kg    LMP  (LMP Unknown)    SpO2 96%    BMI 18.08 kg/m  Physical Exam Constitutional:      Appearance: She is well-developed.  HENT:     Head: Normocephalic and atraumatic.  Eyes:     Conjunctiva/sclera: Conjunctivae  normal.  Cardiovascular:     Rate and Rhythm: Normal rate.  Pulmonary:     Effort: Pulmonary effort is normal. No respiratory distress.  Abdominal:     General: There is no distension.     Tenderness: There is no abdominal tenderness. There is no guarding.  Musculoskeletal:        General: Normal range of motion.     Cervical back: Normal range of motion.  Skin:    General: Skin is warm.     Findings: No rash.  Neurological:     Mental Status: She is alert and oriented to person, place, and time.  Psychiatric:        Behavior: Behavior normal.        Thought Content: Thought content normal.    ED Results / Procedures / Treatments   Labs (all labs ordered are listed, but only abnormal results are displayed) Labs Reviewed  POC URINE PREG, ED - Abnormal; Notable for the following components:      Result Value   Preg Test, Ur Positive (*)    All other components within normal limits  RESP PANEL BY RT-PCR (FLU A&B, COVID) ARPGX2    EKG None  Radiology No results found.  Procedures Procedures    Medications Ordered in ED  Medications - No data to display  ED Course/ Medical Decision Making/ A&P                           Medical Decision Making  27 year old female with first trimester pregnancy.  Patient currently feeling well, asymptomatic.  We will start her on prenatal vitamins.  She is also requesting COVID test, currently asymptomatic but states she was in contact with someone with COVID.  COVID and flu test negative, will give her a note to return to work tomorrow.  She will call OB/GYN clinic to schedule follow-up for her prenatal care.  Center signs she understands signs symptoms return to the ER for. Final Clinical Impression(s) / ED Diagnoses Final diagnoses:  First trimester pregnancy    Rx / DC Orders ED Discharge Orders          Ordered    Prenatal Vit-Fe Fumarate-FA (MULTIVITAMIN-PRENATAL) 27-0.8 MG TABS tablet  Daily        12/08/21 2212               Duanne Guess, PA-C 12/08/21 2216    Naaman Plummer, MD 12/08/21 [phone]

## 2021-12-08 NOTE — ED Notes (Signed)
Presents to the ED with concerns of chills since this morning. 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 Spotting for a day and half. Sore nipples. Naval is protruding out instead of being in. C/o feeling light headed. LMP Beginning of December.

## 2021-12-08 NOTE — ED Triage Notes (Signed)
Pt states she is here for covid test and a urine pregnancy. Pt states she has been having chills since this am and also is concerned she might be pregnant. Pt appears in no acute distress.

## 2021-12-08 NOTE — Discharge Instructions (Signed)
Please take prenatal vitamins daily.  Return to the ER for any fevers abdominal pain vaginal bleeding.  Please call OB/GYN to schedule follow-up appointment.

## 2021-12-13 DIAGNOSIS — Z419 Encounter for procedure for purposes other than remedying health state, unspecified: Secondary | ICD-10-CM | POA: Diagnosis not present

## 2022-01-10 DIAGNOSIS — Z419 Encounter for procedure for purposes other than remedying health state, unspecified: Secondary | ICD-10-CM | POA: Diagnosis not present

## 2022-02-06 ENCOUNTER — Encounter: Payer: Medicaid Other | Admitting: Advanced Practice Midwife

## 2022-02-10 DIAGNOSIS — Z419 Encounter for procedure for purposes other than remedying health state, unspecified: Secondary | ICD-10-CM | POA: Diagnosis not present

## 2022-03-12 DIAGNOSIS — Z419 Encounter for procedure for purposes other than remedying health state, unspecified: Secondary | ICD-10-CM | POA: Diagnosis not present

## 2022-04-12 DIAGNOSIS — Z419 Encounter for procedure for purposes other than remedying health state, unspecified: Secondary | ICD-10-CM | POA: Diagnosis not present

## 2022-05-12 DIAGNOSIS — Z419 Encounter for procedure for purposes other than remedying health state, unspecified: Secondary | ICD-10-CM | POA: Diagnosis not present

## 2022-06-12 DIAGNOSIS — Z419 Encounter for procedure for purposes other than remedying health state, unspecified: Secondary | ICD-10-CM | POA: Diagnosis not present

## 2022-07-13 DIAGNOSIS — Z419 Encounter for procedure for purposes other than remedying health state, unspecified: Secondary | ICD-10-CM | POA: Diagnosis not present

## 2022-08-12 DIAGNOSIS — Z419 Encounter for procedure for purposes other than remedying health state, unspecified: Secondary | ICD-10-CM | POA: Diagnosis not present

## 2022-09-12 DIAGNOSIS — Z419 Encounter for procedure for purposes other than remedying health state, unspecified: Secondary | ICD-10-CM | POA: Diagnosis not present

## 2022-10-12 DIAGNOSIS — Z419 Encounter for procedure for purposes other than remedying health state, unspecified: Secondary | ICD-10-CM | POA: Diagnosis not present

## 2022-11-12 DIAGNOSIS — Z419 Encounter for procedure for purposes other than remedying health state, unspecified: Secondary | ICD-10-CM | POA: Diagnosis not present

## 2022-11-30 DIAGNOSIS — Z3200 Encounter for pregnancy test, result unknown: Secondary | ICD-10-CM | POA: Diagnosis not present

## 2022-11-30 DIAGNOSIS — A749 Chlamydial infection, unspecified: Secondary | ICD-10-CM | POA: Diagnosis not present

## 2022-11-30 DIAGNOSIS — Z3202 Encounter for pregnancy test, result negative: Secondary | ICD-10-CM | POA: Diagnosis not present

## 2022-11-30 DIAGNOSIS — N898 Other specified noninflammatory disorders of vagina: Secondary | ICD-10-CM | POA: Diagnosis not present

## 2022-11-30 DIAGNOSIS — R350 Frequency of micturition: Secondary | ICD-10-CM | POA: Diagnosis not present

## 2022-11-30 DIAGNOSIS — N760 Acute vaginitis: Secondary | ICD-10-CM | POA: Diagnosis not present

## 2022-11-30 DIAGNOSIS — B9689 Other specified bacterial agents as the cause of diseases classified elsewhere: Secondary | ICD-10-CM | POA: Diagnosis not present

## 2022-12-13 DIAGNOSIS — Z419 Encounter for procedure for purposes other than remedying health state, unspecified: Secondary | ICD-10-CM | POA: Diagnosis not present

## 2023-01-11 DIAGNOSIS — Z419 Encounter for procedure for purposes other than remedying health state, unspecified: Secondary | ICD-10-CM | POA: Diagnosis not present

## 2023-02-11 DIAGNOSIS — Z419 Encounter for procedure for purposes other than remedying health state, unspecified: Secondary | ICD-10-CM | POA: Diagnosis not present

## 2023-03-13 DIAGNOSIS — Z419 Encounter for procedure for purposes other than remedying health state, unspecified: Secondary | ICD-10-CM | POA: Diagnosis not present

## 2023-04-03 ENCOUNTER — Other Ambulatory Visit: Payer: Self-pay

## 2023-04-03 ENCOUNTER — Emergency Department
Admission: EM | Admit: 2023-04-03 | Discharge: 2023-04-03 | Disposition: A | Discharge status: Home / Self Care | Payer: Medicaid Other | Attending: Emergency Medicine | Admitting: Emergency Medicine

## 2023-04-03 ENCOUNTER — Emergency Department: Payer: Medicaid Other

## 2023-04-03 DIAGNOSIS — Y990 Civilian activity done for income or pay: Secondary | ICD-10-CM | POA: Insufficient documentation

## 2023-04-03 DIAGNOSIS — M545 Low back pain, unspecified: Secondary | ICD-10-CM | POA: Diagnosis not present

## 2023-04-03 DIAGNOSIS — S39012A Strain of muscle, fascia and tendon of lower back, initial encounter: Secondary | ICD-10-CM | POA: Diagnosis not present

## 2023-04-03 DIAGNOSIS — X500XXA Overexertion from strenuous movement or load, initial encounter: Secondary | ICD-10-CM | POA: Diagnosis not present

## 2023-04-03 DIAGNOSIS — S3992XA Unspecified injury of lower back, initial encounter: Secondary | ICD-10-CM | POA: Diagnosis present

## 2023-04-03 LAB — POC URINE PREG, ED: Preg Test, Ur: NEGATIVE

## 2023-04-03 MED ORDER — KETOROLAC TROMETHAMINE 30 MG/ML IJ SOLN
30.0000 mg | Freq: Once | INTRAMUSCULAR | Status: AC
  Administered 2023-04-03: 30 mg via INTRAMUSCULAR
  Filled 2023-04-03: qty 1

## 2023-04-03 MED ORDER — CYCLOBENZAPRINE HCL 5 MG PO TABS: 5.0000 mg | ORAL_TABLET | Freq: Three times a day (TID) | ORAL | 0 refills | Status: DC | PRN

## 2023-04-03 MED ORDER — MELOXICAM 15 MG PO TABS: 15.0000 mg | ORAL_TABLET | Freq: Every day | ORAL | 0 refills | Status: AC

## 2023-04-03 NOTE — ED Provider Notes (Signed)
[place] EMERGENCY DEPARTMENT AT [facility] Provider Note   CSN: [id number] Arrival date & time: 04/03/23  1913     History  Chief Complaint  Patient presents with   Back Pain    Briana Ortega is a 27 y.o. female.  Presents to the emergency department for evaluation of lower back pain that started yesterday.  Over the last week she started a new job where she is lifting a lot of boxes.  She feels tightness along the lower lumbar spine on the left side with no radiculopathy symptoms.  No abdominal pain fevers chills or urinary symptoms.  She denies any history of back pain.  Patient states her back feels very tight on the left side only.  At times she will feel the muscle spasms.  She has not any medications for pain  HPI     Home Medications Prior to Admission medications   Medication Sig Start Date End Date Taking? Authorizing Provider  cyclobenzaprine (FLEXERIL) 5 MG tablet Take 1-2 tablets (5-10 mg total) by mouth 3 (three) times daily as needed. 04/03/23  Yes Evon Slack, PA-C  meloxicam (MOBIC) 15 MG tablet Take 1 tablet (15 mg total) by mouth daily. 04/03/23 04/02/24 Yes Evon Slack, PA-C  Norethindrone Acetate-Ethinyl Estrad-FE (MICROGESTIN 24 FE) 1-20 MG-MCG(24) tablet Take 1 tablet by mouth daily. Patient not taking: Reported on 01/14/2020 07/13/19   Copland, Ilona Sorrel, PA-C  Prenatal Vit-Fe Fumarate-FA (MULTIVITAMIN-PRENATAL) 27-0.8 MG TABS tablet Take 1 tablet by mouth daily at 12 noon. 12/08/21   Evon Slack, PA-C      Allergies    Patient has no known allergies.    Review of Systems   Review of Systems  Physical Exam Updated Vital Signs BP 133/86 (BP Location: Right Arm)   Pulse 84   Temp 98.4 F (36.9 C) (Oral)   Resp 17   Ht 5\' 7"  (1.702 m)   Wt 55.8 kg   LMP 03/21/2023 (Approximate)   SpO2 100%   BMI 19.26 kg/m  Physical Exam Constitutional:      Appearance: She is well-developed.  HENT:     Head: Normocephalic and atraumatic.   Eyes:     Conjunctiva/sclera: Conjunctivae normal.  Cardiovascular:     Rate and Rhythm: Normal rate.  Pulmonary:     Effort: Pulmonary effort is normal. No respiratory distress.  Abdominal:     General: There is no distension.     Tenderness: There is no abdominal tenderness. There is no guarding.  Musculoskeletal:        General: Normal range of motion.     Cervical back: Normal range of motion.     Comments: Pain with lumbar flexion, palpable muscle spasm left lower lumbar spine.  No spinous process tenderness or paravertebral muscle tenderness on the right side.  No sacral tenderness, SI joint tenderness or iliac crest tenderness.  Both hips move well with internal or external rotation no discomfort.  She is able to ambulate with no antalgic gait no assistive devices.  She is neuro vas intact in bilateral lower extremities.  Skin:    General: Skin is warm.     Findings: No rash.  Neurological:     Mental Status: She is alert and oriented to person, place, and time.  Psychiatric:        Behavior: Behavior normal.        Thought Content: Thought content normal.     ED Results / Procedures / Treatments  Labs (all labs ordered are listed, but only abnormal results are displayed) Labs Reviewed  URINALYSIS, ROUTINE W REFLEX MICROSCOPIC  POC URINE PREG, ED    EKG None  Radiology DG Lumbar Spine Complete  Result Date: 04/03/2023 CLINICAL DATA:  Back pain. EXAM: LUMBAR SPINE - COMPLETE 4+ VIEW COMPARISON:  None Available. FINDINGS: There is no evidence of lumbar spine fracture. Alignment is normal. Intervertebral disc spaces are maintained. IMPRESSION: Negative. Electronically Signed   By: Larose Hires D.O.   On: 04/03/2023 22:25    Procedures Procedures    Medications Ordered in ED Medications  ketorolac (TORADOL) 30 MG/ML injection 30 mg (30 mg Intramuscular Given 04/03/23 2047)    ED Course/ Medical Decision Making/ A&P                             Medical Decision  Making Amount and/or Complexity of Data Reviewed Labs: ordered. Radiology: ordered.  Risk Prescription drug management.   27 year old female with acute lower lumbar back pain after lifting.  She has history and exam findings consistent with lower lumbar muscle spasm.  Her vital signs are stable, she is afebrile.  She has no abdominal discomfort or tenderness on exam.  She has no urinary symptoms and denies any radiculopathy.  X-rays normal of the lumbar spine.  Vital signs are stable.  She did see significant relief with 30 mg of Toradol IM.  Will discharge home with Mobic and Flexeril.  She understands signs symptoms return to the ER for. Final Clinical Impression(s) / ED Diagnoses Final diagnoses:  Strain of lumbar region, initial encounter    Rx / DC Orders ED Discharge Orders          Ordered    meloxicam (MOBIC) 15 MG tablet  Daily        04/03/23 2237    cyclobenzaprine (FLEXERIL) 5 MG tablet  3 times daily PRN        04/03/23 2237              Evon Slack, PA-C 04/03/23 2240    Sharman Cheek, MD 04/03/23 2352

## 2023-04-03 NOTE — ED Triage Notes (Signed)
Pt presents to ER with c/o lower back pain that started yesterday while at work.  Today, pt states pain started to get worse, and states it has become worse to the point that she has had some difficulty waking.  States she does do a lot of heavy lifting at work.  Pt states pain does radiate to buttocks and upper thighs sometimes.  Pt is otherwise A&O x4 and in NAD.

## 2023-04-03 NOTE — Discharge Instructions (Addendum)
Please take meloxicam daily with food and Flexeril as needed for muscle spasm/tightness.  Use a heating pad along the lower lumbar spine and perform stretching exercises as needed.  Return to the ER for any worsening symptoms or any urgent changes in your health.

## 2023-04-04 LAB — URINALYSIS, ROUTINE W REFLEX MICROSCOPIC
Bacteria, UA: NONE SEEN
Bilirubin Urine: NEGATIVE
Glucose, UA: NEGATIVE mg/dL
Hgb urine dipstick: NEGATIVE
Ketones, ur: NEGATIVE mg/dL
Nitrite: POSITIVE — AB
Protein, ur: NEGATIVE mg/dL
Specific Gravity, Urine: 1.024 (ref 1.005–1.030)
pH: 7 (ref 5.0–8.0)

## 2023-04-13 DIAGNOSIS — Z419 Encounter for procedure for purposes other than remedying health state, unspecified: Secondary | ICD-10-CM | POA: Diagnosis not present

## 2023-04-17 DIAGNOSIS — Z3202 Encounter for pregnancy test, result negative: Secondary | ICD-10-CM | POA: Diagnosis not present

## 2023-04-17 DIAGNOSIS — R1032 Left lower quadrant pain: Secondary | ICD-10-CM | POA: Diagnosis not present

## 2023-04-22 DIAGNOSIS — M545 Low back pain, unspecified: Secondary | ICD-10-CM | POA: Diagnosis not present

## 2023-05-13 DIAGNOSIS — Z419 Encounter for procedure for purposes other than remedying health state, unspecified: Secondary | ICD-10-CM | POA: Diagnosis not present

## 2023-06-13 DIAGNOSIS — Z419 Encounter for procedure for purposes other than remedying health state, unspecified: Secondary | ICD-10-CM | POA: Diagnosis not present

## 2023-07-14 DIAGNOSIS — Z419 Encounter for procedure for purposes other than remedying health state, unspecified: Secondary | ICD-10-CM | POA: Diagnosis not present

## 2023-08-13 DIAGNOSIS — Z419 Encounter for procedure for purposes other than remedying health state, unspecified: Secondary | ICD-10-CM | POA: Diagnosis not present

## 2023-09-13 DIAGNOSIS — Z419 Encounter for procedure for purposes other than remedying health state, unspecified: Secondary | ICD-10-CM | POA: Diagnosis not present

## 2023-10-13 DIAGNOSIS — Z419 Encounter for procedure for purposes other than remedying health state, unspecified: Secondary | ICD-10-CM | POA: Diagnosis not present

## 2023-11-13 DIAGNOSIS — Z419 Encounter for procedure for purposes other than remedying health state, unspecified: Secondary | ICD-10-CM | POA: Diagnosis not present

## 2023-11-24 DIAGNOSIS — Z419 Encounter for procedure for purposes other than remedying health state, unspecified: Secondary | ICD-10-CM | POA: Diagnosis not present

## 2023-12-14 DIAGNOSIS — Z419 Encounter for procedure for purposes other than remedying health state, unspecified: Secondary | ICD-10-CM | POA: Diagnosis not present

## 2024-01-11 DIAGNOSIS — Z419 Encounter for procedure for purposes other than remedying health state, unspecified: Secondary | ICD-10-CM | POA: Diagnosis not present

## 2024-02-22 DIAGNOSIS — Z419 Encounter for procedure for purposes other than remedying health state, unspecified: Secondary | ICD-10-CM | POA: Diagnosis not present

## 2024-03-23 DIAGNOSIS — Z419 Encounter for procedure for purposes other than remedying health state, unspecified: Secondary | ICD-10-CM | POA: Diagnosis not present

## 2024-04-23 DIAGNOSIS — Z419 Encounter for procedure for purposes other than remedying health state, unspecified: Secondary | ICD-10-CM | POA: Diagnosis not present

## 2024-05-01 ENCOUNTER — Other Ambulatory Visit

## 2024-05-04 ENCOUNTER — Other Ambulatory Visit

## 2024-05-23 DIAGNOSIS — Z419 Encounter for procedure for purposes other than remedying health state, unspecified: Secondary | ICD-10-CM | POA: Diagnosis not present

## 2024-06-23 DIAGNOSIS — Z419 Encounter for procedure for purposes other than remedying health state, unspecified: Secondary | ICD-10-CM | POA: Diagnosis not present

## 2024-07-24 DIAGNOSIS — Z419 Encounter for procedure for purposes other than remedying health state, unspecified: Secondary | ICD-10-CM | POA: Diagnosis not present

## 2024-08-23 DIAGNOSIS — Z419 Encounter for procedure for purposes other than remedying health state, unspecified: Secondary | ICD-10-CM | POA: Diagnosis not present

## 2024-08-25 ENCOUNTER — Ambulatory Visit
Admission: EM | Admit: 2024-08-25 | Discharge: 2024-08-25 | Disposition: A | Discharge status: Home / Self Care | Attending: Emergency Medicine | Admitting: Emergency Medicine

## 2024-08-25 DIAGNOSIS — Z113 Encounter for screening for infections with a predominantly sexual mode of transmission: Secondary | ICD-10-CM

## 2024-08-25 DIAGNOSIS — R110 Nausea: Secondary | ICD-10-CM | POA: Diagnosis not present

## 2024-08-25 DIAGNOSIS — Z3A01 Less than 8 weeks gestation of pregnancy: Secondary | ICD-10-CM

## 2024-08-25 DIAGNOSIS — R300 Dysuria: Secondary | ICD-10-CM | POA: Diagnosis not present

## 2024-08-25 DIAGNOSIS — Z3A00 Weeks of gestation of pregnancy not specified: Secondary | ICD-10-CM

## 2024-08-25 LAB — POCT URINE DIPSTICK
Bilirubin, UA: NEGATIVE
Blood, UA: NEGATIVE
Glucose, UA: NEGATIVE mg/dL
Ketones, POC UA: NEGATIVE mg/dL
Nitrite, UA: NEGATIVE
Protein Ur, POC: NEGATIVE mg/dL
Spec Grav, UA: 1.02 (ref 1.010–1.025)
Urobilinogen, UA: 0.2 U/dL
pH, UA: 7 (ref 5.0–8.0)

## 2024-08-25 LAB — POCT URINE PREGNANCY: Preg Test, Ur: POSITIVE — AB

## 2024-08-25 MED ORDER — CEPHALEXIN 500 MG PO CAPS: 500.0000 mg | ORAL_CAPSULE | Freq: Three times a day (TID) | ORAL | 0 refills | Status: AC

## 2024-08-25 MED ORDER — DOXYLAMINE-PYRIDOXINE 10-10 MG PO TBEC
1.0000 | DELAYED_RELEASE_TABLET | Freq: Two times a day (BID) | ORAL | 0 refills | Status: AC | PRN
Start: 2024-08-25 — End: ?

## 2024-08-25 NOTE — Discharge Instructions (Addendum)
 Your pregnancy test is positive.  Based on your last menstrual cycle, you are approximately [redacted] weeks pregnant.  Take the Diclegis as directed for nausea related to pregnancy.  Start taking a prenatal vitamin today and schedule an appointment with your OB/GYN.    Take the antibiotic as directed.  The urine culture is pending.  We will call you if it shows the need to change or discontinue your antibiotic.    Your vaginal tests are pending.  Your test results will be available on your MyChart account.  If your test results are positive, we will call you.  Do not have sexual activity until all test results are back and treatment is completed if needed.

## 2024-08-25 NOTE — ED Triage Notes (Signed)
 Patient to Urgent Care with complaints of nausea- suspects she might be pregnant. Symptoms worse in the morning and the middle of the night. Urinary frequency.  LMP- August 23.

## 2024-08-25 NOTE — ED Provider Notes (Signed)
 Briana Ortega    CSN: [id number] Arrival date & time: 08/25/24  1437      History   Chief Complaint Chief Complaint  Patient presents with   Possible Pregnancy    HPI Briana Ortega is a 28 y.o. female.  Patient presents with request for pregnancy test.  She has nausea in the mornings.  LMP 07/04/2024.  Patient also presents with urinary urgency, urinary frequency, mild discomfort with urination.  Patient also presents with request for STD testing, along with testing for BV and yeast.  She denies fever, chills, abdominal pain, vomiting, hematuria, flank pain, vaginal discharge, pelvic pain, vaginal bleeding.  No OTC medications taken.  The history is provided by the patient and medical records.    Past Medical History:  Diagnosis Date   Chlamydia    GC (gonococcus infection)     Patient Active Problem List   Diagnosis Date Noted   Abscess of buttock, right 01/15/2020   Hypokalemia 01/15/2020   Essential hypertension 01/15/2020   Postpartum care following cesarean delivery 06/01/2016   Normal labor and delivery 05/29/2016   Abdominal pain affecting pregnancy 05/08/2016   Indication for care in labor and delivery, antepartum 05/08/2016   Chlamydia trachomatis infection 07/03/2013   Gonorrhea in female 07/03/2013   Vaginal discharge 07/01/2013   BV (bacterial vaginosis) 07/01/2013   Sexually active at young age 01/29/2013   Neck pain 02/24/2013   Well child check 01/11/2012    Past Surgical History:  Procedure Laterality Date   CESAREAN SECTION N/A 05/30/2016   Procedure: CESAREAN SECTION;  Surgeon: Lamar SHAUNNA Lesches, MD;  Location: [facility] ORS;  Service: Obstetrics;  Laterality: N/A;   TOOTH EXTRACTION      OB History     Gravida  2   Para  1   Term  1   Preterm  0   AB  0   Living  1      SAB  0   IAB  0   Ectopic  0   Multiple      Live Births  1            Home Medications    Prior to Admission medications   Medication Sig  Start Date End Date Taking? Authorizing Provider  cephALEXin  (KEFLEX ) 500 MG capsule Take 1 capsule (500 mg total) by mouth 3 (three) times daily for 5 days. 08/25/24 08/30/24 Yes Corlis Burnard DEL, NP  Doxylamine-Pyridoxine (DICLEGIS) 10-10 MG TBEC Take 1 tablet by mouth 2 (two) times daily as needed. 08/25/24  Yes Corlis Burnard DEL, NP  cyclobenzaprine  (FLEXERIL ) 5 MG tablet Take 1-2 tablets (5-10 mg total) by mouth 3 (three) times daily as needed. Patient not taking: Reported on 08/25/2024 04/03/23   Charlene Debby BROCKS, PA-C  Norethindrone Acetate-Ethinyl Estrad-FE (MICROGESTIN  24 FE) 1-20 MG-MCG(24) tablet Take 1 tablet by mouth daily. Patient not taking: Reported on 01/14/2020 07/13/19   Copland, Alicia B, PA-C  Prenatal Vit-Fe Fumarate-FA (MULTIVITAMIN-PRENATAL) 27-0.8 MG TABS tablet Take 1 tablet by mouth daily at 12 noon. Patient not taking: Reported on 08/25/2024 12/08/21   Charlene Debby BROCKS, PA-C    Family History Family History  Problem Relation Age of Onset   Cancer Maternal Grandmother        COLON   Cancer Maternal Grandfather        COLON   Cancer Other        COLON    Social History Social History   Tobacco Use   Smoking  status: Never   Smokeless tobacco: Never  Vaping Use   Vaping status: Never Used  Substance Use Topics   Alcohol use: No   Drug use: No     Allergies   Patient has no known allergies.   Review of Systems Review of Systems  Constitutional:  Negative for chills and fever.  Gastrointestinal:  Positive for nausea. Negative for abdominal pain, diarrhea and vomiting.  Genitourinary:  Positive for dysuria, frequency and urgency. Negative for flank pain, hematuria, pelvic pain, vaginal bleeding and vaginal discharge.     Physical Exam Triage Vital Signs ED Triage Vitals  Encounter Vitals Group     BP 08/25/24 1457 110/61     Girls Systolic BP Percentile --      Girls Diastolic BP Percentile --      Boys Systolic BP Percentile --      Boys Diastolic BP  Percentile --      Pulse Rate 08/25/24 1457 78     Resp 08/25/24 1457 19     Temp 08/25/24 1457 98 F (36.7 C)     Temp src --      SpO2 08/25/24 1457 100 %     Weight --      Height --      Head Circumference --      Peak Flow --      Pain Score 08/25/24 1454 0     Pain Loc --      Pain Education --      Exclude from Growth Chart --    No data found.  Updated Vital Signs BP 110/61   Pulse 78   Temp 98 F (36.7 C)   Resp 19   LMP 07/04/2024   SpO2 100%   Visual Acuity Right Eye Distance:   Left Eye Distance:   Bilateral Distance:    Right Eye Near:   Left Eye Near:    Bilateral Near:     Physical Exam Constitutional:      General: She is not in acute distress. HENT:     Mouth/Throat:     Mouth: Mucous membranes are moist.  Cardiovascular:     Rate and Rhythm: Normal rate and regular rhythm.     Heart sounds: Normal heart sounds.  Pulmonary:     Effort: Pulmonary effort is normal. No respiratory distress.     Breath sounds: Normal breath sounds.  Abdominal:     General: Bowel sounds are normal.     Palpations: Abdomen is soft.     Tenderness: There is no abdominal tenderness. There is no right CVA tenderness, left CVA tenderness, guarding or rebound.  Neurological:     Mental Status: She is alert.      UC Treatments / Results  Labs (all labs ordered are listed, but only abnormal results are displayed) Labs Reviewed  POCT URINE PREGNANCY - Abnormal; Notable for the following components:      Result Value   Preg Test, Ur Positive (*)    All other components within normal limits  POCT URINE DIPSTICK - Abnormal; Notable for the following components:   Color, UA straw (*)    Leukocytes, UA Trace (*)    All other components within normal limits  URINE CULTURE  CERVICOVAGINAL ANCILLARY ONLY    EKG   Radiology No results found.  Procedures Procedures (including critical care time)  Medications Ordered in UC Medications - No data to  display  Initial Impression / Assessment and Plan /  UC Course  I have reviewed the triage vital signs and the nursing notes.  Pertinent labs & imaging results that were available during my care of the patient were reviewed by me and considered in my medical decision making (see chart for details).    Less than [redacted] weeks pregnant, nausea without vomiting, dysuria, STD screening.  LMP 07/04/2024.  Urine pregnancy test positive.  Treating pregnancy related nausea with Diclegis.  Instructed patient to start prenatal vitamins today and to schedule an appointment with her OB/GYN.  She plans to go to [facility].  Education provided on first trimester pregnancy.  Treating her urinary symptoms with cephalexin .  Urine culture pending.  Patient obtained vaginal self swab for STD testing.  Discussed that we will call if any of her test results are positive and that her test results will be available on her MyChart.  Instructed her to abstain from all sexual activity until all test results are back and treatment is completed if needed.  She agrees to plan of care.  Final Clinical Impressions(s) / UC Diagnoses   Final diagnoses:  Less than [redacted] weeks gestation of pregnancy  Nausea without vomiting  Dysuria  Screening for STD (sexually transmitted disease)     Discharge Instructions      Your pregnancy test is positive.  Based on your last menstrual cycle, you are approximately [redacted] weeks pregnant.  Take the Diclegis as directed for nausea related to pregnancy.  Start taking a prenatal vitamin today and schedule an appointment with your OB/GYN.    Take the antibiotic as directed.  The urine culture is pending.  We will call you if it shows the need to change or discontinue your antibiotic.    Your vaginal tests are pending.  Your test results will be available on your MyChart account.  If your test results are positive, we will call you.  Do not have sexual activity until all test results are back and  treatment is completed if needed.       ED Prescriptions     Medication Sig Dispense Auth. Provider   Doxylamine-Pyridoxine (DICLEGIS) 10-10 MG TBEC Take 1 tablet by mouth 2 (two) times daily as needed. 60 tablet Corlis Sor H, NP   cephALEXin  (KEFLEX ) 500 MG capsule Take 1 capsule (500 mg total) by mouth 3 (three) times daily for 5 days. 15 capsule Corlis Sor DEL, NP      PDMP not reviewed this encounter.   Corlis Sor DEL, NP 08/25/24 1537

## 2024-08-26 LAB — CERVICOVAGINAL ANCILLARY ONLY
Bacterial Vaginitis (gardnerella): POSITIVE — AB
Candida Glabrata: NEGATIVE
Candida Vaginitis: NEGATIVE
Chlamydia: NEGATIVE
Comment: NEGATIVE
Comment: NEGATIVE
Comment: NEGATIVE
Comment: NEGATIVE
Comment: NEGATIVE
Comment: NORMAL
Neisseria Gonorrhea: NEGATIVE
Trichomonas: NEGATIVE

## 2024-08-27 LAB — URINE CULTURE: Culture: 10000 — AB

## 2024-08-28 ENCOUNTER — Ambulatory Visit (HOSPITAL_COMMUNITY): Payer: Self-pay

## 2024-08-28 MED ORDER — METRONIDAZOLE 0.75 % VA GEL: 1.0000 | Freq: Every day | VAGINAL | 0 refills | Status: AC

## 2024-09-04 ENCOUNTER — Telehealth (INDEPENDENT_AMBULATORY_CARE_PROVIDER_SITE_OTHER)

## 2024-09-04 DIAGNOSIS — Z3480 Encounter for supervision of other normal pregnancy, unspecified trimester: Secondary | ICD-10-CM

## 2024-09-04 NOTE — Progress Notes (Signed)
 New OB Intake  I connected with  Jereld An on 09/04/24 at 10:15 AM EDT by MyChart Video Visit and verified that I am speaking with the correct person using two identifiers. Nurse is located at [facility] and pt is located at home.  I discussed the limitations, risks, security and privacy concerns of performing an evaluation and management service by telephone and the availability of in person appointments. I also discussed with the patient that there may be a patient responsible charge related to this service. The patient expressed understanding and agreed to proceed.  I explained I am completing New OB Intake today. We discussed her EDD of 04/10/25 that is based on LMP of 07/04/2024. Pt is G2/P1. I reviewed her allergies, medications, Medical/Surgical/OB history, and appropriate screenings. There are cats in the home: no.Based on history, this is a/an pregnancy uncomplicated . Her obstetrical history is significant  She stated she had an emergency  c section prevoise pregancy failure to progress in first stage of labor  Patient Active Problem List   Diagnosis Date Noted   Supervision of other normal pregnancy, antepartum 09/04/2024   Abscess of buttock, right 01/15/2020   Hypokalemia 01/15/2020   Essential hypertension 01/15/2020   Postpartum care following cesarean delivery 06/01/2016   Normal labor and delivery 05/29/2016   Abdominal pain affecting pregnancy 05/08/2016   Indication for care in labor and delivery, antepartum 05/08/2016   Chlamydia trachomatis infection 07/03/2013   Gonorrhea in female 07/03/2013   Vaginal discharge 07/01/2013   BV (bacterial vaginosis) 07/01/2013   Sexually active at young age 42/20/2014   Neck pain 02/24/2013   Well child check 01/11/2012    Concerns addressed today:none   Delivery Plans:  Plans to deliver at [facility].  Anatomy US  Explained first scheduled US  will be 09/14/24. Anatomy US  will be scheduled around [redacted]  weeks gestational age.  Labs Discussed genetic screening with patient. Patient would like genetic testing to be drawn at new OB visit. Discussed possible labs to be drawn at new OB appointment.  COVID Vaccine Patient has not had COVID vaccine.   Social Determinants of Health Food Insecurity: expresses food insecurity. Information given on local food banks. Transportation: Patient denies transportation needs. Childcare: Discussed no children allowed at ultrasound appointments.   First visit review I reviewed new OB appt with pt. I explained she will have blood work and pap smear/pelvic exam if indicated. Explained pt will be seen by Eleanor canny CNM at first visit; encounter routed to appropriate provider.   Annalee VEAR Sanders, CMA 09/04/2024  10:48 AM

## 2024-09-14 ENCOUNTER — Other Ambulatory Visit

## 2024-09-15 ENCOUNTER — Other Ambulatory Visit: Payer: Self-pay

## 2024-09-15 ENCOUNTER — Telehealth: Payer: Self-pay | Admitting: Obstetrics

## 2024-09-15 DIAGNOSIS — Z3687 Encounter for antenatal screening for uncertain dates: Secondary | ICD-10-CM

## 2024-09-15 NOTE — Telephone Encounter (Signed)
 Pt was scheduled for a dating scan on 09/14/2024 at 10:15 per EMERSON Canny.  Pt was rescheduled to 09/18/2024 at 3:00 for the dating scan.

## 2024-09-18 ENCOUNTER — Other Ambulatory Visit

## 2024-09-18 DIAGNOSIS — Z3687 Encounter for antenatal screening for uncertain dates: Secondary | ICD-10-CM

## 2024-09-18 DIAGNOSIS — Z3A10 10 weeks gestation of pregnancy: Secondary | ICD-10-CM | POA: Diagnosis not present

## 2024-09-18 DIAGNOSIS — O3680X0 Pregnancy with inconclusive fetal viability, not applicable or unspecified: Secondary | ICD-10-CM

## 2024-09-23 NOTE — Progress Notes (Addendum)
 NEW OB HISTORY AND PHYSICAL  SUBJECTIVE:       Briana Ortega is a 28 y.o. G63P1001 female, Patient's last menstrual period was 07/04/2024., Estimated Date of Delivery: 04/10/25, [redacted]w[redacted]d, presents today for establishment of Prenatal Care. She reports nausea/vomiting that is resolving, fatigue, and breast tenderness. She has a h/o one prior CS and would like a repeat. She has had some symptomatic vaginal discharge and recently tested positive for BV but did not receive treatment.   Social history Partner/Relationship: partner, Koren Living situation: lives with Koren and their 51-year-old. Feels safe there Work: [employer] Exercise: walking Substance use: denies   Gynecologic History Patient's last menstrual period was 07/04/2024. Normal Contraception: none Last Pap: unsure.   Obstetric History OB History  Gravida Para Term Preterm AB Living  2 1 1  0 0 1  SAB IAB Ectopic Multiple Live Births  0 0 0  1    # Outcome Date GA Lbr Len/2nd Weight Sex Type Anes PTL Lv  2 Current           1 Term 05/30/16 [redacted]w[redacted]d  7 lb 13 oz (3.544 kg)  CS-LTranv   LIV     Birth Comments: ANEMIA; LATE ENTRY TO CARE. C/S FOR FAILURE TO PROGRESS AND ARREST OD DECENT.     Complications: Failure to Progress in First Stage    Past Medical History:  Diagnosis Date   Chlamydia    GC (gonococcus infection)     Past Surgical History:  Procedure Laterality Date   CESAREAN SECTION N/A 05/30/2016   Procedure: CESAREAN SECTION;  Surgeon: Lamar SHAUNNA Lesches, MD;  Location: [facility] ORS;  Service: Obstetrics;  Laterality: N/A;   TOOTH EXTRACTION      Current Outpatient Medications on File Prior to Visit  Medication Sig Dispense Refill   cyclobenzaprine  (FLEXERIL ) 5 MG tablet Take 1-2 tablets (5-10 mg total) by mouth 3 (three) times daily as needed. (Patient not taking: Reported on 08/25/2024) 20 tablet 0   Doxylamine-Pyridoxine (DICLEGIS) 10-10 MG TBEC Take 1 tablet by mouth 2 (two) times daily as needed. 60 tablet 0    Norethindrone Acetate-Ethinyl Estrad-FE (MICROGESTIN  24 FE) 1-20 MG-MCG(24) tablet Take 1 tablet by mouth daily. (Patient not taking: Reported on 01/14/2020) 84 tablet 3   Prenatal Vit-Fe Fumarate-FA (MULTIVITAMIN-PRENATAL) 27-0.8 MG TABS tablet Take 1 tablet by mouth daily at 12 noon. (Patient not taking: Reported on 08/25/2024) 30 tablet 0   No current facility-administered medications on file prior to visit.    No Known Allergies  Social History   Socioeconomic History   Marital status: Single    Spouse name: Not on file   Number of children: Not on file   Years of education: Not on file   Highest education level: GED or equivalent  Occupational History   Occupation: not working  Tobacco Use   Smoking status: Never   Smokeless tobacco: Never  Vaping Use   Vaping status: Never Used  Substance and Sexual Activity   Alcohol use: No   Drug use: No   Sexual activity: Yes    Birth control/protection: Condom  Other Topics Concern   Not on file  Social History Narrative   ** Merged History Encounter **       Social Drivers of Health   Financial Resource Strain: Medium Risk (09/04/2024)   Overall Financial Resource Strain (CARDIA)    Difficulty of Paying Living Expenses: Somewhat hard  Food Insecurity: Food Insecurity Present (09/04/2024)   Hunger Vital Sign  Worried About Programme Researcher, Broadcasting/film/video in the Last Year: Sometimes true    [employer] of Food in the Last Year: Patient declined  Transportation Needs: No Transportation Needs (09/04/2024)   PRAPARE - Administrator, Civil Service (Medical): No    Lack of Transportation (Non-Medical): No  Physical Activity: Sufficiently Active (09/04/2024)   Exercise Vital Sign    Days of Exercise per Week: 7 days    Minutes of Exercise per Session: 30 min  Stress: No Stress Concern Present (09/04/2024)   [employer] of Occupational Health - Occupational Stress Questionnaire    Feeling of Stress: Not at all  Social  Connections: Moderately Integrated (09/04/2024)   Social Connection and Isolation Panel    Frequency of Communication with Friends and Family: More than three times a week    Frequency of Social Gatherings with Friends and Family: More than three times a week    Attends Religious Services: More than 4 times per year    Active Member of [employer] or Organizations: No    Attends Banker Meetings: Never    Marital Status: Living with partner  Intimate Partner Violence: Not At Risk (09/04/2024)   Humiliation, Afraid, Rape, and Kick questionnaire    Fear of Current or Ex-Partner: No    Emotionally Abused: No    Physically Abused: No    Sexually Abused: No    Family History  Problem Relation Age of Onset   Cancer Maternal Grandmother        COLON   Cancer Maternal Grandfather        COLON   Cancer Other        COLON    The following portions of the patient's history were reviewed and updated as appropriate: allergies, current medications, past OB history, past medical history, past surgical history, past family history, past social history, and problem list.  Constitutional: Denied constitutional symptoms, night sweats, recent illness, fatigue, fever, insomnia and weight loss.  Eyes: Denied eye symptoms, eye pain, photophobia, vision change and visual disturbance.  Ears/Nose/Throat/Neck: Denied ear, nose, throat or neck symptoms, hearing loss, nasal discharge, sinus congestion and sore throat.  Cardiovascular: Denied cardiovascular symptoms, arrhythmia, chest pain/pressure, edema, exercise intolerance, orthopnea and palpitations.  Respiratory: Denied pulmonary symptoms, asthma, pleuritic pain, productive sputum, cough, dyspnea and wheezing.  Gastrointestinal: Denied gastro-esophageal reflux, melena, nausea and vomiting.  Genitourinary: Denied genitourinary symptoms including symptomatic vaginal discharge, pelvic relaxation issues, and urinary complaints.  Musculoskeletal: Denied  musculoskeletal symptoms, stiffness, swelling, muscle weakness and myalgia.  Dermatologic: Denied dermatology symptoms, rash and scar.  Neurologic: Denied neurology symptoms, dizziness, headache, neck pain and syncope.  Psychiatric: Denied psychiatric symptoms, anxiety and depression.  Endocrine: Denied endocrine symptoms including hot flashes and night sweats.    Indications for ASA therapy (per uptodate) One of the following: Previous pregnancy with preeclampsia, especially early onset and with an adverse outcome No Multifetal gestation No Chronic hypertension No Type 1 or 2 diabetes mellitus No Chronic kidney disease No Autoimmune disease (antiphospholipid syndrome, systemic lupus erythematosus) No  Two or more of the following: Nulliparity No Obesity (body mass index >30 kg/m2) No Family history of preeclampsia in mother or sister No Age >=35 years No Sociodemographic characteristics (African American race, low socioeconomic level) Yes Personal risk factors (eg, previous pregnancy with low birth weight or small for gestational age infant, previous adverse pregnancy outcome [eg, stillbirth], interval >10 years between pregnancies) No   OBJECTIVE: Initial Physical Exam (New OB)  GENERAL APPEARANCE: alert, well appearing HEAD: normocephalic, atraumatic MOUTH: mucous membranes moist, pharynx normal without lesions THYROID: enlarged BREASTS: no masses noted, no significant tenderness, no palpable axillary nodes, no skin changes LUNGS: clear to auscultation, no wheezes, rales or rhonchi, symmetric air entry HEART: regular rate and rhythm, no murmurs ABDOMEN: soft, nontender, nondistended, no abnormal masses, no epigastric pain and FHT present EXTREMITIES: no redness or tenderness in the calves or thighs SKIN: normal coloration and turgor, no rashes LYMPH NODES: no adenopathy palpable NEUROLOGIC: alert, oriented, normal speech, no focal findings or movement disorder  noted  PELVIC EXAM EXTERNAL GENITALIA: normal appearing vulva with no masses, tenderness or lesions VAGINA: no abnormal discharge or lesions CERVIX: no lesions or cervical motion tenderness and Pap collected  ASSESSMENT: Normal pregnancy [redacted]w[redacted]d H/o prior CS, desires repeat   PLAN: Routine prenatal care. We discussed an overview of prenatal care and when to call. Reviewed diet, exercise, and weight gain recommendations in pregnancy. Discussed benefits of breastfeeding and lactation resources at [facility] I answered all questions. Labs and genetic screening today. Rx sent for metronidazole  for BV. Needs MD visit ~28 weeks to schedule CS.  See orders  Eleanor Canny, CNM

## 2024-09-28 ENCOUNTER — Other Ambulatory Visit (HOSPITAL_COMMUNITY)
Admission: RE | Admit: 2024-09-28 | Discharge: 2024-09-28 | Disposition: A | Discharge status: Home / Self Care | Source: Ambulatory Visit | Attending: Obstetrics | Admitting: Obstetrics

## 2024-09-28 ENCOUNTER — Encounter: Payer: Self-pay | Admitting: Obstetrics

## 2024-09-28 ENCOUNTER — Ambulatory Visit (INDEPENDENT_AMBULATORY_CARE_PROVIDER_SITE_OTHER): Admitting: Obstetrics

## 2024-09-28 VITALS — BP 121/73 | HR 83 | Wt 145.0 lb

## 2024-09-28 DIAGNOSIS — Z0283 Encounter for blood-alcohol and blood-drug test: Secondary | ICD-10-CM

## 2024-09-28 DIAGNOSIS — Z3481 Encounter for supervision of other normal pregnancy, first trimester: Secondary | ICD-10-CM

## 2024-09-28 DIAGNOSIS — Z1379 Encounter for other screening for genetic and chromosomal anomalies: Secondary | ICD-10-CM | POA: Diagnosis not present

## 2024-09-28 DIAGNOSIS — Z124 Encounter for screening for malignant neoplasm of cervix: Secondary | ICD-10-CM

## 2024-09-28 DIAGNOSIS — Z131 Encounter for screening for diabetes mellitus: Secondary | ICD-10-CM | POA: Diagnosis not present

## 2024-09-28 DIAGNOSIS — Z13220 Encounter for screening for lipoid disorders: Secondary | ICD-10-CM

## 2024-09-28 DIAGNOSIS — Z3480 Encounter for supervision of other normal pregnancy, unspecified trimester: Secondary | ICD-10-CM | POA: Diagnosis not present

## 2024-09-28 DIAGNOSIS — D649 Anemia, unspecified: Secondary | ICD-10-CM | POA: Diagnosis not present

## 2024-09-28 DIAGNOSIS — O34219 Maternal care for unspecified type scar from previous cesarean delivery: Secondary | ICD-10-CM | POA: Insufficient documentation

## 2024-09-28 DIAGNOSIS — Z3A12 12 weeks gestation of pregnancy: Secondary | ICD-10-CM

## 2024-09-28 DIAGNOSIS — Z0184 Encounter for antibody response examination: Secondary | ICD-10-CM | POA: Diagnosis not present

## 2024-09-28 DIAGNOSIS — Z113 Encounter for screening for infections with a predominantly sexual mode of transmission: Secondary | ICD-10-CM

## 2024-09-28 MED ORDER — METRONIDAZOLE 500 MG PO TABS: 500.0000 mg | ORAL_TABLET | Freq: Two times a day (BID) | ORAL | 0 refills | Status: DC

## 2024-09-28 MED ORDER — ONDANSETRON 4 MG PO TBDP: 4.0000 mg | ORAL_TABLET | Freq: Three times a day (TID) | ORAL | 0 refills | Status: AC | PRN

## 2024-09-29 ENCOUNTER — Other Ambulatory Visit

## 2024-09-29 DIAGNOSIS — D649 Anemia, unspecified: Secondary | ICD-10-CM | POA: Diagnosis not present

## 2024-09-29 DIAGNOSIS — Z3480 Encounter for supervision of other normal pregnancy, unspecified trimester: Secondary | ICD-10-CM | POA: Diagnosis not present

## 2024-09-29 DIAGNOSIS — Z0184 Encounter for antibody response examination: Secondary | ICD-10-CM | POA: Diagnosis not present

## 2024-09-29 DIAGNOSIS — Z13220 Encounter for screening for lipoid disorders: Secondary | ICD-10-CM | POA: Diagnosis not present

## 2024-09-29 DIAGNOSIS — Z1379 Encounter for other screening for genetic and chromosomal anomalies: Secondary | ICD-10-CM | POA: Diagnosis not present

## 2024-09-29 DIAGNOSIS — Z0283 Encounter for blood-alcohol and blood-drug test: Secondary | ICD-10-CM | POA: Diagnosis not present

## 2024-09-29 DIAGNOSIS — Z113 Encounter for screening for infections with a predominantly sexual mode of transmission: Secondary | ICD-10-CM | POA: Diagnosis not present

## 2024-09-29 DIAGNOSIS — Z131 Encounter for screening for diabetes mellitus: Secondary | ICD-10-CM | POA: Diagnosis not present

## 2024-09-29 LAB — URINALYSIS, ROUTINE W REFLEX MICROSCOPIC
Bilirubin, UA: NEGATIVE
Glucose, UA: NEGATIVE
Ketones, UA: NEGATIVE
Leukocytes,UA: NEGATIVE
Nitrite, UA: NEGATIVE
Protein,UA: NEGATIVE
RBC, UA: NEGATIVE
Specific Gravity, UA: 1.027 (ref 1.005–1.030)
Urobilinogen, Ur: 0.2 mg/dL (ref 0.2–1.0)
pH, UA: 6 (ref 5.0–7.5)

## 2024-09-29 LAB — CYTOLOGY - PAP
Chlamydia: NEGATIVE
Comment: NEGATIVE
Comment: NORMAL
Diagnosis: NEGATIVE
Neisseria Gonorrhea: NEGATIVE

## 2024-09-29 LAB — PROTEIN / CREATININE RATIO, URINE
Creatinine, Urine: 166.6 mg/dL
Protein, Ur: 12.5 mg/dL
Protein/Creat Ratio: 75 mg/g{creat} (ref 0–200)

## 2024-09-30 ENCOUNTER — Ambulatory Visit: Payer: Self-pay | Admitting: Obstetrics

## 2024-09-30 LAB — CBC/D/PLT+RPR+RH+ABO+RUBIGG...
Antibody Screen: NEGATIVE
Basophils Absolute: 0 x10E3/uL (ref 0.0–0.2)
Basos: 1 %
EOS (ABSOLUTE): 0.1 x10E3/uL (ref 0.0–0.4)
Eos: 1 %
HCV Ab: NONREACTIVE
HIV Screen 4th Generation wRfx: NONREACTIVE
Hematocrit: 37.4 % (ref 34.0–46.6)
Hemoglobin: 12.1 g/dL (ref 11.1–15.9)
Hepatitis B Surface Ag: NEGATIVE
Immature Grans (Abs): 0 x10E3/uL (ref 0.0–0.1)
Immature Granulocytes: 0 %
Lymphocytes Absolute: 1.3 x10E3/uL (ref 0.7–3.1)
Lymphs: 20 %
MCH: 29.7 pg (ref 26.6–33.0)
MCHC: 32.4 g/dL (ref 31.5–35.7)
MCV: 92 fL (ref 79–97)
Monocytes Absolute: 0.5 x10E3/uL (ref 0.1–0.9)
Monocytes: 7 %
Neutrophils Absolute: 4.5 x10E3/uL (ref 1.4–7.0)
Neutrophils: 71 %
Platelets: 293 x10E3/uL (ref 150–450)
RBC: 4.07 x10E6/uL (ref 3.77–5.28)
RDW: 14.5 % (ref 11.7–15.4)
RPR Ser Ql: NONREACTIVE
Rh Factor: POSITIVE
Rubella Antibodies, IGG: 17.9 {index} (ref >0.99)
Varicella zoster IgG: NONREACTIVE
WBC: 6.3 x10E3/uL (ref 3.4–10.8)

## 2024-09-30 LAB — COMPREHENSIVE METABOLIC PANEL WITH GFR
ALT: 13 IU/L (ref 0–32)
AST: 15 IU/L (ref 0–40)
Albumin: 4.4 g/dL (ref 4.0–5.0)
Alkaline Phosphatase: 71 IU/L (ref 41–116)
BUN/Creatinine Ratio: 14 (ref 9–23)
BUN: 8 mg/dL (ref 6–20)
Bilirubin Total: 0.6 mg/dL (ref 0.0–1.2)
CO2: 19 mmol/L — ABNORMAL LOW (ref 20–29)
Calcium: 9.9 mg/dL (ref 8.7–10.2)
Chloride: 99 mmol/L (ref 96–106)
Creatinine, Ser: 0.59 mg/dL (ref 0.57–1.00)
Globulin, Total: 2.5 g/dL (ref 1.5–4.5)
Glucose: 79 mg/dL (ref 70–99)
Potassium: 4.2 mmol/L (ref 3.5–5.2)
Sodium: 135 mmol/L (ref 134–144)
Total Protein: 6.9 g/dL (ref 6.0–8.5)
eGFR: 128 mL/min/1.73 (ref >59)

## 2024-09-30 LAB — MONITOR DRUG PROFILE 14(MW)
Amphetamine Scrn, Ur: NEGATIVE ng/mL
BARBITURATE SCREEN URINE: NEGATIVE ng/mL
BENZODIAZEPINE SCREEN, URINE: NEGATIVE ng/mL
Buprenorphine, Urine: NEGATIVE ng/mL
CANNABINOIDS UR QL SCN: NEGATIVE ng/mL
Cocaine (Metab) Scrn, Ur: NEGATIVE ng/mL
Creatinine(Crt), U: 158 mg/dL (ref 20.0–300.0)
Fentanyl, Urine: NEGATIVE pg/mL
Meperidine Screen, Urine: NEGATIVE ng/mL
Methadone Screen, Urine: NEGATIVE ng/mL
OXYCODONE+OXYMORPHONE UR QL SCN: NEGATIVE ng/mL
Opiate Scrn, Ur: NEGATIVE ng/mL
Ph of Urine: 5.7 (ref 4.5–8.9)
Phencyclidine Qn, Ur: NEGATIVE ng/mL
Propoxyphene Scrn, Ur: NEGATIVE ng/mL
SPECIFIC GRAVITY: 1.023
Tramadol Screen, Urine: NEGATIVE ng/mL

## 2024-09-30 LAB — HEMOGLOBIN A1C
Est. average glucose Bld gHb Est-mCnc: 108 mg/dL
Hgb A1c MFr Bld: 5.4 % (ref 4.8–5.6)

## 2024-09-30 LAB — TSH PREGNANCY: TSH Pregnancy: 1.15 u[IU]/mL (ref 0.450–4.500)

## 2024-09-30 LAB — HCV INTERPRETATION

## 2024-09-30 LAB — NICOTINE SCREEN, URINE: Cotinine Ql Scrn, Ur: NEGATIVE ng/mL

## 2024-10-01 LAB — URINE CULTURE, OB REFLEX

## 2024-10-01 LAB — CULTURE, OB URINE

## 2024-10-03 LAB — MATERNIT 21 PLUS CORE, BLOOD
Fetal Fraction: 9
Result (T21): NEGATIVE
Trisomy 13 (Patau syndrome): NEGATIVE
Trisomy 18 (Edwards syndrome): NEGATIVE
Trisomy 21 (Down syndrome): NEGATIVE

## 2024-10-26 ENCOUNTER — Encounter: Admitting: Obstetrics

## 2024-10-27 NOTE — Patient Instructions (Incomplete)
 Second Trimester of Pregnancy  The second trimester of pregnancy is from week 14 through week 27. This is months 4 through 6 of pregnancy. During the second trimester: Morning sickness is less or has stopped. You may have more energy. You may feel hungry more often. At this time, your unborn baby is growing very fast. At the end of the sixth month, the unborn baby may be up to 12 inches long and weigh about 1 pounds. You will likely start to feel the baby move between 16 and 20 weeks of pregnancy. Body changes during your second trimester Your body continues to change during this time. The changes usually go away after your baby is born. Physical changes You will gain more weight. Your belly will get bigger. You may begin to get stretch marks on your hips, belly, and breasts. Your breasts will keep growing and may hurt. You may get dark spots or blotches on your face. A dark line from your belly button to the pubic area may appear. This line is called linea nigra. Your hair may grow faster and get thicker. Health changes You may have headaches. You may have heartburn. You may pee more often. You may have swollen, bulging veins (varicose veins). You may have trouble pooping (constipation), or swollen veins in the butt that can itch or get painful (hemorrhoids). You may have back pain. This is caused by: Weight gain. Pregnancy hormones that are relaxing the joints in your pelvis. Follow these instructions at home: Medicines Talk to your health care provider if you're taking medicines. Ask if the medicines are safe to take during pregnancy. Your provider may change the medicines that you take. Do not take any medicines unless told to by your provider. Take a prenatal vitamin that has at least 600 micrograms (mcg) of folic acid. Do not use herbal medicines, illegal drugs, or medicines that are not approved by your provider. Eating and drinking While you're pregnant your body needs  extra food for your growing baby. Talk with your provider about what to eat while pregnant. Activity Most women are able to exercise during pregnancy. Exercises may need to change as your pregnancy goes on. Talk to your provider about your activities and exercise routines. Relieving pain and discomfort Wear a good, supportive bra if your breasts hurt. Rest with your legs raised if you have leg cramps or low back pain. Take warm sitz baths to soothe pain from hemorrhoids. Use hemorrhoid cream if your provider says it's okay. Do not douche. Do not use tampons or scented pads. Do not use hot tubs, steam rooms, or saunas. Safety Wear your seatbelt at all times when you're in a car. Talk to your provider if someone hits you, hurts you, or yells at you. Talk with your provider if you're feeling sad or have thoughts of hurting yourself. Lifestyle Certain things can be harmful while you're pregnant. It's best to avoid the following: Do not drink alcohol,smoke, vape, or use products with nicotine or tobacco in them. If you need help quitting, talk with your provider. Avoid cat litter boxes and soil used by cats. These things carry germs that can cause harm to your pregnancy and your baby. General instructions Keep all follow-up visits. It helps you and your unborn baby stay as healthy as possible. Write down your questions. Take them to your prenatal visits. Your provider will: Talk with you about your overall health. Give you advice or refer you to specialists who can help with different needs,  including: Prenatal education classes. Mental health and counseling. Foods and healthy eating. Ask for help if you need help with food. Where to find more information American Pregnancy Association: americanpregnancy.org [employer]: acog.org Office on [employer] Health: [employer] Contact a health care provider if: You have a headache that does not go away  when you take medicine. You have any of these problems: You can't eat or drink. You throw up or feel like you may throw up. You have watery poop (diarrhea) for 2 days or more. You have pain when you pee or your pee smells bad. You have been sick for 2 days or more and are not getting better. Contact your provider right away if: You have any of these coming from your vagina: Abnormal discharge. Bad-smelling fluid. Bleeding. Your baby is moving less than usual. You have contractions, belly cramping, or have pain in your pelvis or lower back. You have symptoms of high blood pressure or preeclampsia. These include: A severe, throbbing headache that does not go away. Sudden or extreme swelling of your face, hands, legs, or feet. Vision problems: You see spots. You have blurry vision. Your eyes are sensitive to light. If you can't reach the provider, go to an urgent care or emergency room. Get help right away if: You faint, become confused, or can't think clearly. You have chest pain or trouble breathing. You have any kind of injury, such as from a fall or a car crash. These symptoms may be an emergency. Call 911 right away. Do not wait to see if the symptoms will go away. Do not drive yourself to the hospital. This information is not intended to replace advice given to you by your health care provider. Make sure you discuss any questions you have with your health care provider. Document Revised: 08/01/2023 Document Reviewed: 03/01/2023 Elsevier Patient Education  2024 [employer].

## 2024-10-27 NOTE — Progress Notes (Unsigned)
° ° °  Return Prenatal Note   Subjective   28 y.o. G2P1001 at [redacted]w[redacted]d presents for this follow-up prenatal visit.  Patient feeling well, feeling flutters. Desires rCD after pushing x3h without descent & experience was traumatic & painful. Reviewed planning for 39w scheduled CD, sooner if spontaneous labor. Patient reports: Movement: Present Contractions: Not present  Objective   Flow sheet Vitals: Pulse Rate: 86 BP: 112/63 Fetal Heart Rate (bpm): 150 Total weight gain: 24 lb 3.2 oz (11 kg)  General Appearance  No acute distress, well appearing, and well nourished Pulmonary   Normal work of breathing Neurologic   Alert and oriented to person, place, and time Psychiatric   Mood and affect within normal limits   Assessment/Plan   Plan  28 y.o. G2P1001 at [redacted]w[redacted]d presents for follow-up OB visit. Reviewed prenatal record including previous visit note.  Supervision of other normal pregnancy, antepartum Red flag symptoms reviewed. AFP indications discussed, declines today. Anatomy ultrasound ordered. Flu vaccine today.      Orders Placed This Encounter  Procedures   US  OB Comp + 14 Wk    Standing Status:   Future    Expected Date:   11/28/2024    Expiration Date:   10/28/2025    Reason for Exam (SYMPTOM  OR DIAGNOSIS REQUIRED):   anatomy    Preferred Imaging Location?:   Internal   Return in 4 weeks (on 11/25/2024) for ROB & Ultrasound.   Future Appointments  Date Time Provider Department Center  11/25/2024  8:15 AM [facility] US  1 [facility] None  11/25/2024  9:35 AM Jayne Harlene CROME, CNM [facility] None     For next visit:  continue with routine prenatal care     Harlene CROME Jayne, CNM  12/17/20258:45 AM

## 2024-10-28 ENCOUNTER — Ambulatory Visit: Admitting: Certified Nurse Midwife

## 2024-10-28 ENCOUNTER — Encounter: Payer: Self-pay | Admitting: Certified Nurse Midwife

## 2024-10-28 VITALS — BP 112/63 | HR 86 | Wt 149.2 lb

## 2024-10-28 DIAGNOSIS — Z3482 Encounter for supervision of other normal pregnancy, second trimester: Secondary | ICD-10-CM

## 2024-10-28 DIAGNOSIS — Z3480 Encounter for supervision of other normal pregnancy, unspecified trimester: Secondary | ICD-10-CM

## 2024-10-28 DIAGNOSIS — Z23 Encounter for immunization: Secondary | ICD-10-CM

## 2024-10-28 DIAGNOSIS — Z3A16 16 weeks gestation of pregnancy: Secondary | ICD-10-CM | POA: Diagnosis not present

## 2024-10-28 DIAGNOSIS — Z363 Encounter for antenatal screening for malformations: Secondary | ICD-10-CM

## 2024-10-28 NOTE — Assessment & Plan Note (Signed)
 Red flag symptoms reviewed. AFP indications discussed, declines today. Anatomy ultrasound ordered. Flu vaccine today.

## 2024-11-24 NOTE — Progress Notes (Unsigned)
" ° ° °  Return Prenatal Note   Subjective   29 y.o. G2P1001 at [redacted]w[redacted]d presents for this follow-up prenatal visit.  Patient reports [facility] told her they were worried about her weight gain as well as her iron levels. She reports hx of low weight due to stress/activity levels and feels she is now working harder to stay healthy due to pregnancy, pre-pregnancy weight may have been as low as 112lb--last documented weight in her medical record prior to pregnancy (04/2023), though she thinks was likely 115-120lb. Unsure what her hgb result was. Patient reports: Movement: Present Contractions: Not present  Objective   Flow sheet Vitals: Pulse Rate: 87 BP: 105/65 Fetal Heart Rate (bpm): 150 Total weight gain: 30 lb 9.6 oz (13.9 kg)  General Appearance  No acute distress, well appearing, and well nourished Pulmonary   Normal work of breathing Neurologic   Alert and oriented to person, place, and time Psychiatric   Mood and affect within normal limits   Assessment/Plan   Plan  29 y.o. G2P1001 at [redacted]w[redacted]d presents for follow-up OB visit. Reviewed prenatal record including previous visit note.  Supervision of other normal pregnancy, antepartum Red flag symptoms reviewed. Weight gain reviewed, recommend monitoring food intake-write down what you are eating for 2-3 days, eliminate high sugar drinks (sweet tea, sodas-reports she mostly drinks water), increase protein & decrease simple carbs. Anatomy u/s scheduled 1/16.      No orders of the defined types were placed in this encounter.  Return in 4 weeks (on 12/23/2024) for ROB.   Future Appointments  Date Time Provider Department Center  11/27/2024  1:00 PM [facility] US  1 [facility] None  12/23/2024  9:35 AM Charma Domino, CNM [facility] None    For next visit:  continue with routine prenatal care     Harlene LITTIE Cisco, CNM  1/14/20269:47 AM  "

## 2024-11-24 NOTE — Patient Instructions (Signed)
 Second Trimester of Pregnancy  The second trimester of pregnancy is from week 14 through week 27. This is months 4 through 6 of pregnancy. During the second trimester: Morning sickness is less or has stopped. You may have more energy. You may feel hungry more often. At this time, your unborn baby is growing very fast. At the end of the sixth month, the unborn baby may be up to 12 inches long and weigh about 1 pounds. You will likely start to feel the baby move between 16 and 20 weeks of pregnancy. Body changes during your second trimester Your body continues to change during this time. The changes usually go away after your baby is born. Physical changes You will gain more weight. Your belly will get bigger. You may begin to get stretch marks on your hips, belly, and breasts. Your breasts will keep growing and may hurt. You may get dark spots or blotches on your face. A dark line from your belly button to the pubic area may appear. This line is called linea nigra. Your hair may grow faster and get thicker. Health changes You may have headaches. You may have heartburn. You may pee more often. You may have swollen, bulging veins (varicose veins). You may have trouble pooping (constipation), or swollen veins in the butt that can itch or get painful (hemorrhoids). You may have back pain. This is caused by: Weight gain. Pregnancy hormones that are relaxing the joints in your pelvis. Follow these instructions at home: Medicines Talk to your health care provider if you're taking medicines. Ask if the medicines are safe to take during pregnancy. Your provider may change the medicines that you take. Do not take any medicines unless told to by your provider. Take a prenatal vitamin that has at least 600 micrograms (mcg) of folic acid. Do not use herbal medicines, illegal drugs, or medicines that are not approved by your provider. Eating and drinking While you're pregnant your body needs  extra food for your growing baby. Talk with your provider about what to eat while pregnant. Activity Most women are able to exercise during pregnancy. Exercises may need to change as your pregnancy goes on. Talk to your provider about your activities and exercise routines. Relieving pain and discomfort Wear a good, supportive bra if your breasts hurt. Rest with your legs raised if you have leg cramps or low back pain. Take warm sitz baths to soothe pain from hemorrhoids. Use hemorrhoid cream if your provider says it's okay. Do not douche. Do not use tampons or scented pads. Do not use hot tubs, steam rooms, or saunas. Safety Wear your seatbelt at all times when you're in a car. Talk to your provider if someone hits you, hurts you, or yells at you. Talk with your provider if you're feeling sad or have thoughts of hurting yourself. Lifestyle Certain things can be harmful while you're pregnant. It's best to avoid the following: Do not drink alcohol,smoke, vape, or use products with nicotine or tobacco in them. If you need help quitting, talk with your provider. Avoid cat litter boxes and soil used by cats. These things carry germs that can cause harm to your pregnancy and your baby. General instructions Keep all follow-up visits. It helps you and your unborn baby stay as healthy as possible. Write down your questions. Take them to your prenatal visits. Your provider will: Talk with you about your overall health. Give you advice or refer you to specialists who can help with different needs,  including: Prenatal education classes. Mental health and counseling. Foods and healthy eating. Ask for help if you need help with food. Where to find more information American Pregnancy Association: americanpregnancy.org [employer]: acog.org Office on [employer] Health: [employer] Contact a health care provider if: You have a headache that does not go away  when you take medicine. You have any of these problems: You can't eat or drink. You throw up or feel like you may throw up. You have watery poop (diarrhea) for 2 days or more. You have pain when you pee or your pee smells bad. You have been sick for 2 days or more and are not getting better. Contact your provider right away if: You have any of these coming from your vagina: Abnormal discharge. Bad-smelling fluid. Bleeding. Your baby is moving less than usual. You have contractions, belly cramping, or have pain in your pelvis or lower back. You have symptoms of high blood pressure or preeclampsia. These include: A severe, throbbing headache that does not go away. Sudden or extreme swelling of your face, hands, legs, or feet. Vision problems: You see spots. You have blurry vision. Your eyes are sensitive to light. If you can't reach the provider, go to an urgent care or emergency room. Get help right away if: You faint, become confused, or can't think clearly. You have chest pain or trouble breathing. You have any kind of injury, such as from a fall or a car crash. These symptoms may be an emergency. Call 911 right away. Do not wait to see if the symptoms will go away. Do not drive yourself to the hospital. This information is not intended to replace advice given to you by your health care provider. Make sure you discuss any questions you have with your health care provider. Document Revised: 08/01/2023 Document Reviewed: 03/01/2023 Elsevier Patient Education  2024 [employer].

## 2024-11-25 ENCOUNTER — Ambulatory Visit: Admitting: Certified Nurse Midwife

## 2024-11-25 ENCOUNTER — Ambulatory Visit

## 2024-11-25 VITALS — BP 105/65 | HR 87 | Wt 155.6 lb

## 2024-11-25 DIAGNOSIS — Z3482 Encounter for supervision of other normal pregnancy, second trimester: Secondary | ICD-10-CM | POA: Diagnosis not present

## 2024-11-25 DIAGNOSIS — Z3480 Encounter for supervision of other normal pregnancy, unspecified trimester: Secondary | ICD-10-CM

## 2024-11-25 DIAGNOSIS — Z3A20 20 weeks gestation of pregnancy: Secondary | ICD-10-CM

## 2024-11-26 NOTE — Assessment & Plan Note (Signed)
 Red flag symptoms reviewed. Weight gain reviewed, recommend monitoring food intake-write down what you are eating for 2-3 days, eliminate high sugar drinks (sweet tea, sodas-reports she mostly drinks water), increase protein & decrease simple carbs. Anatomy u/s scheduled 1/16.

## 2024-11-27 ENCOUNTER — Ambulatory Visit (INDEPENDENT_AMBULATORY_CARE_PROVIDER_SITE_OTHER)

## 2024-11-27 DIAGNOSIS — Z3A20 20 weeks gestation of pregnancy: Secondary | ICD-10-CM

## 2024-11-27 DIAGNOSIS — Z363 Encounter for antenatal screening for malformations: Secondary | ICD-10-CM | POA: Diagnosis not present

## 2024-12-23 ENCOUNTER — Encounter: Admitting: Registered Nurse
